# Patient Record
Sex: Female | Born: 1965 | Race: Asian | Hispanic: No | Marital: Married | Smoking: Never smoker
Health system: Southern US, Community
[De-identification: ages and names within clinical notes are randomized; demographics above are authoritative.]

---

## 1985-07-25 HISTORY — PX: ABDOMINAL SURGERY: SHX537

## 2012-10-27 ENCOUNTER — Ambulatory Visit (INDEPENDENT_AMBULATORY_CARE_PROVIDER_SITE_OTHER): Payer: BC Managed Care – PPO | Admitting: Family Medicine

## 2012-10-27 VITALS — BP 134/86 | HR 71 | Temp 99.0°F | Resp 17 | Ht 63.5 in | Wt 132.0 lb

## 2012-10-27 DIAGNOSIS — Z23 Encounter for immunization: Secondary | ICD-10-CM

## 2012-10-27 DIAGNOSIS — D72819 Decreased white blood cell count, unspecified: Secondary | ICD-10-CM

## 2012-10-27 DIAGNOSIS — Z0000 Encounter for general adult medical examination without abnormal findings: Secondary | ICD-10-CM

## 2012-10-27 LAB — COMPREHENSIVE METABOLIC PANEL
Albumin: 4.5 g/dL (ref 3.5–5.2)
Alkaline Phosphatase: 57 U/L (ref 39–117)
BUN: 11 mg/dL (ref 6–23)
Calcium: 9.4 mg/dL (ref 8.4–10.5)
Chloride: 104 mEq/L (ref 96–112)
Glucose, Bld: 100 mg/dL — ABNORMAL HIGH (ref 70–99)
Potassium: 4.5 mEq/L (ref 3.5–5.3)
Sodium: 138 mEq/L (ref 135–145)
Total Protein: 7.3 g/dL (ref 6.0–8.3)

## 2012-10-27 LAB — POCT CBC
Granulocyte percent: 47.9 %G (ref 37–80)
HCT, POC: 40.4 % (ref 37.7–47.9)
Hemoglobin: 12.5 g/dL (ref 12.2–16.2)
MCV: 70.2 fL — AB (ref 80–97)
POC Granulocyte: 1.6 — AB (ref 2–6.9)
RBC: 5.76 M/uL — AB (ref 4.04–5.48)

## 2012-10-27 LAB — LIPID PANEL
Cholesterol: 275 mg/dL — ABNORMAL HIGH (ref 0–200)
LDL Cholesterol: 182 mg/dL — ABNORMAL HIGH (ref 0–99)
Triglycerides: 89 mg/dL (ref <150)
VLDL: 18 mg/dL (ref 0–40)

## 2012-10-27 NOTE — Patient Instructions (Addendum)
I will be in touch with your when your labs come in.  You are over- due for a mammogram; our office will make an appointment for you to have a mammogram and we will call you about this.    When you turn 48 you will be due for a colonoscopy.  Please come and see [place] in one year for follow- up

## 2012-10-27 NOTE — Progress Notes (Addendum)
[facility] [address] [place] [address] [phone]  Date:  10/27/2012   Name:  Destiny Quinn   DOB:  January 18, 1966   MRN:  [id number]  PCP:  No PCP Per Patient    Chief Complaint: Annual Exam   History of Present Illness:  Destiny Quinn is a 47 y.o. very pleasant female patient who presents with the following:  Here today for a CPE.  She is a new patient.  She has not seen a doctor in some time.   She is fasting today for labs.  She would like to do a pap smear.   No known health problems.  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   She does not smoke or drink.  She does walk for exercise quite a bit.   She is here today with her husband.  They do not have any children.   She has not had a period in about 3 years.  No bleeding since then- she is through menopause.    She is due for a tetanus shot, and also has never had a mammogram.    In the 1980s she had some sort of operation in [place]- some sort of bladder procedure.    There is no problem list on file for this patient.   No past medical history on file.  No past surgical history on file.  History  Substance Use Topics  . Smoking status: Never Smoker   . Smokeless tobacco: Not on file  . Alcohol Use: Not on file    No family history on file.  No Known Allergies  Medication list has been reviewed and updated.  No current outpatient prescriptions on file prior to visit.   No current facility-administered medications on file prior to visit.    Review of Systems:  As per HPI- otherwise negative.   Physical Examination: Filed Vitals:   10/27/12 0936  BP: 134/86  Pulse: 71  Temp: 99 F (37.2 C)  Resp: 17   Filed Vitals:   10/27/12 0936  Height: 5' 3.5" (1.613 m)  Weight: 132 lb (59.875 kg)   Body mass index is 23.01 kg/(m^2). Ideal Body Weight: Weight in (lb) to have BMI = 25: 143.1  GEN: WDWN, NAD, Non-toxic, A & O x 3, appears well and fit for age HEENT: Atraumatic,  Normocephalic. Neck supple. No masses, No LAD.  Bilateral TM wnl, oropharynx normal.  PEERL,EOMI.   Ears and Nose: No external deformity. CV: RRR, No M/G/R. No JVD. No thrill. No extra heart sounds. PULM: CTA B, no wheezes, crackles, rhonchi. No retractions. No resp. distress. No accessory muscle use. ABD: S, NT, ND, +BS. No rebound. No HSM. EXTR: No c/c/e NEURO Normal gait.  PSYCH: Normally interactive. Conversant. Not depressed or anxious appearing.  Calm demeanor.  Breast: normal exam bilaterally, no masses, dimpling or discharge GU: normal exam, nulliparous cervix.  No adnexal TTP or masses.     Assessment and Plan: Physical exam, annual - Plan: POCT CBC, Comprehensive metabolic panel, TSH, Lipid panel, Pap IG, CT/NG w/ reflex HPV when ASC-U, Tdap vaccine greater than or equal to 7yo IM  CPE for age today.  Continue healthy habits.  Tdap today, pap and labs as above.  Mammogram needed- will have referral schedule as language is a problem Will plan further follow- up pending labs.   Signed Abbe Amsterdam, MD  4/10- called to discuss labs and cholesterol.  However, there is a language barrier and  I could not located her husband or other appropriate person with whom to discuss results.  Will send a letter

## 2012-10-30 LAB — PAP IG, CT-NG, RFX HPV ASCU: Chlamydia Probe Amp: NEGATIVE

## 2012-11-01 ENCOUNTER — Encounter: Payer: Self-pay | Admitting: Family Medicine

## 2012-11-01 NOTE — Addendum Note (Signed)
Addended by: Abbe Amsterdam C on: 11/01/2012 04:47 PM   Modules accepted: Orders

## 2012-12-22 ENCOUNTER — Ambulatory Visit (INDEPENDENT_AMBULATORY_CARE_PROVIDER_SITE_OTHER): Payer: BC Managed Care – PPO | Admitting: Family Medicine

## 2012-12-22 VITALS — BP 120/70 | HR 55 | Temp 98.5°F | Resp 16 | Ht 63.5 in | Wt 123.0 lb

## 2012-12-22 DIAGNOSIS — D72819 Decreased white blood cell count, unspecified: Secondary | ICD-10-CM

## 2012-12-22 DIAGNOSIS — E785 Hyperlipidemia, unspecified: Secondary | ICD-10-CM

## 2012-12-22 DIAGNOSIS — Z1231 Encounter for screening mammogram for malignant neoplasm of breast: Secondary | ICD-10-CM

## 2012-12-22 DIAGNOSIS — Z1239 Encounter for other screening for malignant neoplasm of breast: Secondary | ICD-10-CM

## 2012-12-22 LAB — POCT CBC
HCT, POC: 39.7 % (ref 37.7–47.9)
Lymph, poc: 1.8 (ref 0.6–3.4)
MCHC: 31 g/dL — AB (ref 31.8–35.4)
MCV: 70.8 fL — AB (ref 80–97)
MID (cbc): 0.2 (ref 0–0.9)
POC Granulocyte: 1.1 — AB (ref 2–6.9)
POC LYMPH PERCENT: 57.7 %L — AB (ref 10–50)
Platelet Count, POC: 218 10*3/uL (ref 142–424)
RDW, POC: 15.4 %

## 2012-12-22 LAB — LIPID PANEL
Cholesterol: 209 mg/dL — ABNORMAL HIGH (ref 0–200)
LDL Cholesterol: 133 mg/dL — ABNORMAL HIGH (ref 0–99)
Triglycerides: 74 mg/dL (ref <150)
VLDL: 15 mg/dL (ref 0–40)

## 2012-12-22 NOTE — Progress Notes (Addendum)
Subjective:    Patient ID: Destiny Quinn, female    DOB: [birth year], 47 y.o.   MRN: [id number] Chief Complaint  Patient presents with  . Follow-up    cholesterol and blood preesure    HPI  Ms. Holzheimer is a pleseant 47 yo [place] woman with no sig PMHx who is here with her husband who translaters for her.  About a month ago had an isolated episode where she felt lightheaded, no other sxs and has not recurred but they were were really worried she was having a stroke. Because of this, she came in for a full CPP - everything was found to be nml except LDL 189 so they are here to have it rechecked today and see if she needs medicaiton, looks like mammogram referral was not placed.  Also, Dr. Dallas Schimke has placed a future order for recheck of cbc due to a mild leukopenia.  History reviewed. No pertinent past medical history. No current outpatient prescriptions on file prior to visit.   No current facility-administered medications on file prior to visit.   No Known Allergies   Review of Systems  Constitutional: Positive for fatigue. Negative for fever, chills, diaphoresis and appetite change.  Eyes: Negative for visual disturbance.  Respiratory: Negative for cough and shortness of breath.   Cardiovascular: Negative for chest pain, palpitations and leg swelling.  Genitourinary: Negative for decreased urine volume.  Neurological: Positive for light-headedness. Negative for syncope and headaches.  Hematological: Does not bruise/bleed easily.      BP 120/70  Pulse 55  Temp(Src) 98.5 F (36.9 C) (Oral)  Resp 16  Ht 5' 3.5" (1.613 m)  Wt 123 lb (55.792 kg)  BMI 21.44 kg/m2  SpO2 99%  LMP 10/27/2012 Objective:   Physical Exam  Constitutional: She is oriented to person, place, and time. She appears well-developed and well-nourished. No distress.  HENT:  Head: Normocephalic and atraumatic.  Right Ear: External ear normal.  Left Ear: External ear normal.  Eyes: Conjunctivae are normal. No  scleral icterus.  Neck: Normal range of motion. Neck supple. Normal carotid pulses present. Carotid bruit is not present. No thyromegaly present.  Cardiovascular: Normal rate, regular rhythm, normal heart sounds and intact distal pulses.   Pulmonary/Chest: Effort normal and breath sounds normal. No respiratory distress.  Musculoskeletal: She exhibits no edema.  Lymphadenopathy:    She has no cervical adenopathy.  Neurological: She is alert and oriented to person, place, and time.  Skin: Skin is warm and dry. She is not diaphoretic. No erythema.  Psychiatric: She has a normal mood and affect. Her behavior is normal.          Results for orders placed in visit on 12/22/12  LIPID PANEL      Result Value Range   Cholesterol 209 (*) 0 - 200 mg/dL   Triglycerides 74  <409 mg/dL   HDL 61  >81 mg/dL   Total CHOL/HDL Ratio 3.4     VLDL 15  0 - 40 mg/dL   LDL Cholesterol 191 (*) 0 - 99 mg/dL  FERRITIN      Result Value Range   Ferritin 69  10 - 291 ng/mL  POCT CBC      Result Value Range   WBC 3.2 (*) 4.6 - 10.2 K/uL   Lymph, poc 1.8  0.6 - 3.4   POC LYMPH PERCENT 57.7 (*) 10 - 50 %L   MID (cbc) 0.2  0 - 0.9   POC MID % 6.8  0 -  12 %M   POC Granulocyte 1.1 (*) 2 - 6.9   Granulocyte percent 35.5 (*) 37 - 80 %G   RBC 5.61 (*) 4.04 - 5.48 M/uL   Hemoglobin 12.3  12.2 - 16.2 g/dL   HCT, POC 16.1  09.6 - 47.9 %   MCV 70.8 (*) 80 - 97 fL   MCH, POC 21.9 (*) 27 - 31.2 pg   MCHC 31.0 (*) 31.8 - 35.4 g/dL   RDW, POC 04.5     Platelet Count, POC 218  142 - 424 K/uL   MPV    0 - 99.8 fL    Assessment & Plan:  Screening breast examination - Plan: MM Digital Screening - prefers weekend due to work but could due Thurs afternoon if no other option. Gave handout in [place] on mammograms.  Other and unspecified hyperlipidemia - Plan: Lipid panel - recheck again as pt fasting today. If not improved, will need to start statin and pt and husband agreeable to plan. Gave info in [place] on  health diet and how to eat more fruits and veggies.  Leukopenia - Plan: Ferritin, POCT CBC - likely chronic as mild but cont monitoring and cons hiv test and smear, nml ferritin despite microcytic red cells  No orders of the defined types were placed in this encounter.     Call [facility] on cell phone for all appts and info - best way to reach husband who speaks [place]

## 2012-12-23 ENCOUNTER — Encounter: Payer: Self-pay | Admitting: Family Medicine

## 2013-08-10 ENCOUNTER — Telehealth: Payer: Self-pay

## 2013-08-10 DIAGNOSIS — Z1239 Encounter for other screening for malignant neoplasm of breast: Secondary | ICD-10-CM

## 2013-08-10 NOTE — Telephone Encounter (Signed)
Patient's husband came to clinic to inquire about sending his wife to a specialist for a breast exam. Informed him that I would give this message to clinical and have them return call with more information.  Please call Donnie Coffinhat Brodbeck @ [phone]

## 2013-08-12 NOTE — Telephone Encounter (Signed)
Pt was seen on 12/22/2012 for breast exam and it was recommended for her to have a mammogram. Can we refer?

## 2013-08-12 NOTE — Telephone Encounter (Signed)
If pt is having a problem with her breast she needs to have a diagnostic mammogram.  ? Language barrier - if able to get info over the phone if she is having a problem she should RTC so we can order the correct test - if she is not having a problem we can order a screening mammo.

## 2013-08-13 NOTE — Telephone Encounter (Signed)
Spoke to pt. Minor language barrier. Pt is not experiencing any problems with her breast. Denies lumps, tenderness, pain. Pt would like to have the mammogram as discussed at her last office visit. Order has been placed.

## 2013-08-28 ENCOUNTER — Ambulatory Visit
Admission: RE | Admit: 2013-08-28 | Discharge: 2013-08-28 | Disposition: A | Discharge status: Home / Self Care | Payer: 59 | Source: Ambulatory Visit | Attending: Family Medicine | Admitting: Family Medicine

## 2013-08-28 DIAGNOSIS — Z1231 Encounter for screening mammogram for malignant neoplasm of breast: Secondary | ICD-10-CM

## 2013-09-05 ENCOUNTER — Other Ambulatory Visit: Payer: Self-pay | Admitting: Family Medicine

## 2013-09-05 DIAGNOSIS — R928 Other abnormal and inconclusive findings on diagnostic imaging of breast: Secondary | ICD-10-CM

## 2013-09-11 ENCOUNTER — Other Ambulatory Visit: Payer: Self-pay | Admitting: Family Medicine

## 2013-09-11 ENCOUNTER — Other Ambulatory Visit: Payer: Self-pay

## 2013-09-11 DIAGNOSIS — R928 Other abnormal and inconclusive findings on diagnostic imaging of breast: Secondary | ICD-10-CM

## 2013-09-17 ENCOUNTER — Ambulatory Visit
Admission: RE | Admit: 2013-09-17 | Discharge: 2013-09-17 | Disposition: A | Discharge status: Home / Self Care | Payer: 59 | Source: Ambulatory Visit | Attending: Family Medicine | Admitting: Family Medicine

## 2013-09-17 DIAGNOSIS — R928 Other abnormal and inconclusive findings on diagnostic imaging of breast: Secondary | ICD-10-CM

## 2013-09-30 ENCOUNTER — Other Ambulatory Visit: Payer: Self-pay | Admitting: Physician Assistant

## 2013-09-30 DIAGNOSIS — R921 Mammographic calcification found on diagnostic imaging of breast: Secondary | ICD-10-CM

## 2013-10-08 ENCOUNTER — Other Ambulatory Visit: Payer: Self-pay | Admitting: Physician Assistant

## 2013-10-08 ENCOUNTER — Ambulatory Visit
Admission: RE | Admit: 2013-10-08 | Discharge: 2013-10-08 | Disposition: A | Discharge status: Home / Self Care | Payer: 59 | Source: Ambulatory Visit | Attending: Physician Assistant | Admitting: Physician Assistant

## 2013-10-08 DIAGNOSIS — R921 Mammographic calcification found on diagnostic imaging of breast: Secondary | ICD-10-CM

## 2013-10-09 ENCOUNTER — Ambulatory Visit
Admission: RE | Admit: 2013-10-09 | Discharge: 2013-10-09 | Disposition: A | Discharge status: Home / Self Care | Payer: 59 | Source: Ambulatory Visit | Attending: Physician Assistant | Admitting: Physician Assistant

## 2013-10-09 DIAGNOSIS — R921 Mammographic calcification found on diagnostic imaging of breast: Secondary | ICD-10-CM

## 2014-01-01 ENCOUNTER — Ambulatory Visit (INDEPENDENT_AMBULATORY_CARE_PROVIDER_SITE_OTHER): Payer: 59 | Admitting: Family Medicine

## 2014-01-01 VITALS — BP 120/74 | HR 67 | Temp 98.7°F | Resp 16 | Ht 63.5 in | Wt 126.0 lb

## 2014-01-01 DIAGNOSIS — R5381 Other malaise: Secondary | ICD-10-CM

## 2014-01-01 DIAGNOSIS — Z609 Problem related to social environment, unspecified: Secondary | ICD-10-CM

## 2014-01-01 DIAGNOSIS — R5383 Other fatigue: Secondary | ICD-10-CM

## 2014-01-01 DIAGNOSIS — R42 Dizziness and giddiness: Secondary | ICD-10-CM

## 2014-01-01 DIAGNOSIS — H538 Other visual disturbances: Secondary | ICD-10-CM

## 2014-01-01 DIAGNOSIS — Z789 Other specified health status: Secondary | ICD-10-CM

## 2014-01-01 DIAGNOSIS — H9319 Tinnitus, unspecified ear: Secondary | ICD-10-CM

## 2014-01-01 LAB — POCT CBC
Granulocyte percent: 46.5 %G (ref 37–80)
HCT, POC: 44 % (ref 37.7–47.9)
Hemoglobin: 13.7 g/dL (ref 12.2–16.2)
Lymph, poc: 1.8 (ref 0.6–3.4)
MCH, POC: 22.2 pg — AB (ref 27–31.2)
MCHC: 31.1 g/dL — AB (ref 31.8–35.4)
MCV: 71.5 fL — AB (ref 80–97)
MID (CBC): 0.2 (ref 0–0.9)
MPV: 8.3 fL (ref 0–99.8)
PLATELET COUNT, POC: 331 10*3/uL (ref 142–424)
POC Granulocyte: 1.8 — AB (ref 2–6.9)
POC LYMPH PERCENT: 47.2 %L (ref 10–50)
POC MID %: 6.3 % (ref 0–12)
RBC: 6.16 M/uL — AB (ref 4.04–5.48)
RDW, POC: 15.9 %
WBC: 3.9 10*3/uL — AB (ref 4.6–10.2)

## 2014-01-01 LAB — POCT SEDIMENTATION RATE: POCT SED RATE: 12 mm/hr (ref 0–22)

## 2014-01-01 LAB — GLUCOSE, POCT (MANUAL RESULT ENTRY): POC Glucose: 91 mg/dl (ref 70–99)

## 2014-01-01 MED ORDER — MECLIZINE HCL 12.5 MG PO TABS: 12.5000 mg | ORAL_TABLET | Freq: Three times a day (TID) | ORAL | Status: DC | PRN

## 2014-01-01 MED ORDER — DIAZEPAM 2 MG PO TABS: 2.0000 mg | ORAL_TABLET | Freq: Four times a day (QID) | ORAL | Status: DC | PRN

## 2014-01-01 NOTE — Progress Notes (Signed)
Subjective: 48 year old [place] American lady who does not speak a lot of [place]. Her husband came with her and helped to interpret well for her. She apparently has done well since her last physical examination a year ago. However about 2 weeks ago she had an episode of sudden onset dizziness and sensation of loss or blurring of vision. She has had some intermittent mild ringing in the ears. She felt like she could do nothing when this happened. She was driving initially. Yesterday she had the same thing happened again when she was walking. She felt nauseated but did not vomit. She felt abnormal in her hand but it wasn't really a bad headache. She had no loss of consciousness or falling down. She had to miss work yesterday and again today. She has had some mild dizziness episodes in the past, but nothing like these 2 episodes. Not reporting any episodes of vertigo triggered by rolling over in bed. She has felt fatigued. 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  Objective: Pleasant alert somewhat anxious lady who does not speak a lot of [place]. Her TMs are normal, small amount of dry wax in the left. Eyes PERRLA. EOMs intact. Fundi appear benign. Throat clear. Neck supple without nodes. No carotid bruits. Chest is clear to auscultation. Heart regular without murmurs. Cranial nerves grossly intact. Finger to nose normal. Romberg negative. Tandem walk normal. Motor is symmetrical. Deep tendon reflexes symmetrical.  Assessment: Vertigo, recurrent Mild ringing in ears Visual blurring Language barrier  Plan:  Check a CBC, sedimentation rate, glucose and Cmet. Assuming these are normal, will treat with Antivert for about a week. Leave off work today. Give some 2 milligrams diazepam to use on a when necessary basis. Cautioned her to pull over if driving and this should start,. Call for help if needed. Return in about 2 weeks for her  annual physical examination. She was coming in for that today, but we converted this to an acute care visit.  Results for orders placed in visit on 01/01/14  POCT CBC      Result Value Ref Range   WBC 3.9 (*) 4.6 - 10.2 K/uL   Lymph, poc 1.8  0.6 - 3.4   POC LYMPH PERCENT 47.2  10 - 50 %L   MID (cbc) 0.2  0 - 0.9   POC MID % 6.3  0 - 12 %M   POC Granulocyte 1.8 (*) 2 - 6.9   Granulocyte percent 46.5  37 - 80 %G   RBC 6.16 (*) 4.04 - 5.48 M/uL   Hemoglobin 13.7  12.2 - 16.2 g/dL   HCT, POC 73.7  10.6 - 47.9 %   MCV 71.5 (*) 80 - 97 fL   MCH, POC 22.2 (*) 27 - 31.2 pg   MCHC 31.1 (*) 31.8 - 35.4 g/dL   RDW, POC 26.9     Platelet Count, POC 331  142 - 424 K/uL   MPV 8.3  0 - 99.8 fL  GLUCOSE, POCT (MANUAL RESULT ENTRY)      Result Value Ref Range   POC Glucose 91  70 - 99 mg/dl

## 2014-01-01 NOTE — Patient Instructions (Addendum)
Take meclizine 12.5 milligrams 3 times daily in the morning, afternoon, and evening for one week. Save the rest of the medicine just to use if needed for further episodes of this.  Take diazepam 2 mg one pill only if needed for an acute attack of the dizziness or vision blurring. This is not to be taken on a regular basis.  If worse episodes return sooner, otherwise just come back for the regular yearly physical about 2 weeks from now.  I work 8-4 on June 24  If you're driving and have an episode like this you should pull off the road immediately and call for help if necessary.

## 2014-01-02 LAB — COMPREHENSIVE METABOLIC PANEL
ALK PHOS: 60 U/L (ref 39–117)
ALT: 17 U/L (ref 0–35)
AST: 14 U/L (ref 0–37)
Albumin: 4.9 g/dL (ref 3.5–5.2)
BILIRUBIN TOTAL: 0.7 mg/dL (ref 0.2–1.2)
BUN: 12 mg/dL (ref 6–23)
CO2: 22 mEq/L (ref 19–32)
Calcium: 9.7 mg/dL (ref 8.4–10.5)
Chloride: 104 mEq/L (ref 96–112)
Creat: 0.55 mg/dL (ref 0.50–1.10)
Glucose, Bld: 103 mg/dL — ABNORMAL HIGH (ref 70–99)
Potassium: 5 mEq/L (ref 3.5–5.3)
SODIUM: 140 meq/L (ref 135–145)
TOTAL PROTEIN: 7.6 g/dL (ref 6.0–8.3)

## 2014-01-15 ENCOUNTER — Ambulatory Visit (INDEPENDENT_AMBULATORY_CARE_PROVIDER_SITE_OTHER): Payer: 59 | Admitting: Family Medicine

## 2014-01-15 VITALS — BP 120/68 | HR 62 | Temp 98.9°F | Resp 16 | Ht 63.0 in | Wt 128.6 lb

## 2014-01-15 DIAGNOSIS — Z0000 Encounter for general adult medical examination without abnormal findings: Secondary | ICD-10-CM

## 2014-01-15 DIAGNOSIS — R42 Dizziness and giddiness: Secondary | ICD-10-CM | POA: Diagnosis not present

## 2014-01-15 LAB — COMPREHENSIVE METABOLIC PANEL
ALK PHOS: 72 U/L (ref 39–117)
ALT: 34 U/L (ref 0–35)
AST: 23 U/L (ref 0–37)
Albumin: 4.9 g/dL (ref 3.5–5.2)
BUN: 12 mg/dL (ref 6–23)
CO2: 28 mEq/L (ref 19–32)
Calcium: 10 mg/dL (ref 8.4–10.5)
Chloride: 103 mEq/L (ref 96–112)
Creat: 0.66 mg/dL (ref 0.50–1.10)
GLUCOSE: 95 mg/dL (ref 70–99)
Potassium: 5 mEq/L (ref 3.5–5.3)
Sodium: 140 mEq/L (ref 135–145)
Total Bilirubin: 0.6 mg/dL (ref 0.2–1.2)
Total Protein: 7.8 g/dL (ref 6.0–8.3)

## 2014-01-15 LAB — POCT CBC
Granulocyte percent: 38.9 %G (ref 37–80)
HCT, POC: 42.4 % (ref 37.7–47.9)
Hemoglobin: 13.2 g/dL (ref 12.2–16.2)
Lymph, poc: 2.4 (ref 0.6–3.4)
MCH, POC: 22.3 pg — AB (ref 27–31.2)
MCHC: 31.1 g/dL — AB (ref 31.8–35.4)
MCV: 71.7 fL — AB (ref 80–97)
MID (cbc): 0.3 (ref 0–0.9)
MPV: 9.7 fL (ref 0–99.8)
PLATELET COUNT, POC: 307 10*3/uL (ref 142–424)
POC GRANULOCYTE: 1.7 — AB (ref 2–6.9)
POC LYMPH PERCENT: 55 %L — AB (ref 10–50)
POC MID %: 6.1 % (ref 0–12)
RBC: 5.91 M/uL — AB (ref 4.04–5.48)
RDW, POC: 16.4 %
WBC: 4.3 10*3/uL — AB (ref 4.6–10.2)

## 2014-01-15 LAB — POCT SEDIMENTATION RATE

## 2014-01-15 LAB — LIPID PANEL
Cholesterol: 299 mg/dL — ABNORMAL HIGH (ref 0–200)
HDL: 56 mg/dL (ref >39)
LDL Cholesterol: 206 mg/dL — ABNORMAL HIGH (ref 0–99)
TRIGLYCERIDES: 183 mg/dL — AB (ref <150)
Total CHOL/HDL Ratio: 5.3 Ratio
VLDL: 37 mg/dL (ref 0–40)

## 2014-01-15 LAB — POCT GLYCOSYLATED HEMOGLOBIN (HGB A1C): Hemoglobin A1C: 5.6

## 2014-01-15 LAB — GLUCOSE, POCT (MANUAL RESULT ENTRY): POC GLUCOSE: 87 mg/dL (ref 70–99)

## 2014-01-15 MED ORDER — MECLIZINE HCL 12.5 MG PO TABS: 12.5000 mg | ORAL_TABLET | Freq: Three times a day (TID) | ORAL | Status: DC | PRN

## 2014-01-15 NOTE — Progress Notes (Signed)
Physical exam:  History: Patient is here for a completion of her physical and followup on the dizziness episode. She has done well. The meclizine has helped her a lot. She had one daily she was worse and she started taking the medicine regularly and has done well. She would like some more of the meclizine.  Past medical history:  General he is been a healthy person Operations: Cholecystectomy Major illnesses: None Regular medications: None except the meclizine and when necessary diazepam Allergies: None Last period: No longer has her menses  Family history: All her family is deceased  Social history: Married. Works a Theatre stage managerassembly line. They attend the [place] [address].  Review of systems: Occasional palpitations. Occasional dyspnea. Remaining review of systems is really unremarkable.  Physical exam: Pleasant lady in no acute distress. TMs normal. Eyes PERRLA. Fundi benign. Throat clear. Neck supple without nodes thyromegaly. No carotid bruits. Chest clear. Heart regular without murmurs. Abdomen soft without mass or tenderness. Extremities unremarkable. Skin unremarkable. Had her breast mammogram earlier this year.  Assessment: Dizziness and vertigo, improved Otherwise normal physical exam  Plan: Continue using the meclizine on an in as-needed basis.  Return annually

## 2014-01-15 NOTE — Patient Instructions (Signed)
Use the meclizine when needed for the dizziness. If you're doing well without it you can just keep it for when needed.  Return yearly for a physical examination, sooner if needed.

## 2014-01-22 MED ORDER — PRAVASTATIN SODIUM 20 MG PO TABS: 20.0000 mg | ORAL_TABLET | Freq: Every day | ORAL | Status: DC

## 2014-01-22 NOTE — Addendum Note (Signed)
Addended by: Johnnette LitterARDWELL, Casin Federici M on: 01/22/2014 01:39 PM   Modules accepted: Orders

## 2014-04-19 ENCOUNTER — Ambulatory Visit (INDEPENDENT_AMBULATORY_CARE_PROVIDER_SITE_OTHER): Payer: 59 | Admitting: Family Medicine

## 2014-04-19 VITALS — BP 128/72 | HR 78 | Temp 98.3°F | Resp 16 | Ht 63.5 in | Wt 123.8 lb

## 2014-04-19 DIAGNOSIS — H6122 Impacted cerumen, left ear: Secondary | ICD-10-CM

## 2014-04-19 DIAGNOSIS — H6120 Impacted cerumen, unspecified ear: Secondary | ICD-10-CM

## 2014-04-19 DIAGNOSIS — E785 Hyperlipidemia, unspecified: Secondary | ICD-10-CM

## 2014-04-19 MED ORDER — PRAVASTATIN SODIUM 20 MG PO TABS: 20.0000 mg | ORAL_TABLET | Freq: Every day | ORAL | Status: DC

## 2014-04-19 NOTE — Patient Instructions (Signed)
Continue the pravastatin 20 mg one daily. However we will see what the results of the lab tests show, and if it is not low enough still with to increase the dose.  Plan to return in about February.

## 2014-04-19 NOTE — Progress Notes (Signed)
Subjective: 48 year old lady well-known to me who has been on medicine for cholesterol. About 2 weeks ago she had a noise in her left here. There were some discomfort. She has not been having the dizziness. Otherwise she feels well with no major complaints.  Objective Right TM is normal. Left occluded by pleural fluid. Throat clear. Neck supple without nodes. Chest clear. Heart regular without murmurs. And soft.  Assessment:  Hyperlipidemia Otalgia Cerumen impaction left ear, irrigated by physician  Plan: Irrigate left ear Check lipids Continue the pravastatin 20 mg daily and lasts the labs showed that she needs a different dose.  Procedure: Assistant had work on irrigating the year out, but only got it partially clean. There is wax impacted against the drum. I personally did further work with curetting and irrigating until I was able to get the ear cleaned out and could well visualize the drum. There is a little canal erythema from the procedure, but the drum looks fine now.

## 2014-04-20 LAB — LIPID PANEL
CHOL/HDL RATIO: 2.8 ratio
Cholesterol: 167 mg/dL (ref 0–200)
HDL: 60 mg/dL (ref >39)
LDL Cholesterol: 69 mg/dL (ref 0–99)
Triglycerides: 191 mg/dL — ABNORMAL HIGH (ref <150)
VLDL: 38 mg/dL (ref 0–40)

## 2014-04-20 LAB — COMPREHENSIVE METABOLIC PANEL
ALBUMIN: 4.4 g/dL (ref 3.5–5.2)
ALT: 16 U/L (ref 0–35)
AST: 18 U/L (ref 0–37)
Alkaline Phosphatase: 61 U/L (ref 39–117)
BUN: 10 mg/dL (ref 6–23)
CO2: 29 mEq/L (ref 19–32)
Calcium: 9.5 mg/dL (ref 8.4–10.5)
Chloride: 106 mEq/L (ref 96–112)
Creat: 0.84 mg/dL (ref 0.50–1.10)
GLUCOSE: 120 mg/dL — AB (ref 70–99)
POTASSIUM: 4.3 meq/L (ref 3.5–5.3)
SODIUM: 142 meq/L (ref 135–145)
Total Bilirubin: 0.3 mg/dL (ref 0.2–1.2)
Total Protein: 7.2 g/dL (ref 6.0–8.3)

## 2014-04-21 ENCOUNTER — Encounter: Payer: Self-pay | Admitting: *Deleted

## 2015-12-18 ENCOUNTER — Other Ambulatory Visit: Payer: Self-pay

## 2015-12-18 DIAGNOSIS — Z1231 Encounter for screening mammogram for malignant neoplasm of breast: Secondary | ICD-10-CM

## 2016-01-01 ENCOUNTER — Other Ambulatory Visit: Payer: Self-pay | Admitting: Family Medicine

## 2016-01-01 ENCOUNTER — Ambulatory Visit
Admission: RE | Admit: 2016-01-01 | Discharge: 2016-01-01 | Disposition: A | Discharge status: Home / Self Care | Payer: No Typology Code available for payment source | Source: Ambulatory Visit

## 2016-01-01 DIAGNOSIS — Z1231 Encounter for screening mammogram for malignant neoplasm of breast: Secondary | ICD-10-CM

## 2016-07-15 ENCOUNTER — Ambulatory Visit (INDEPENDENT_AMBULATORY_CARE_PROVIDER_SITE_OTHER): Payer: Self-pay | Admitting: Family Medicine

## 2016-07-15 ENCOUNTER — Ambulatory Visit (INDEPENDENT_AMBULATORY_CARE_PROVIDER_SITE_OTHER): Payer: Self-pay

## 2016-07-15 VITALS — BP 100/68 | HR 74 | Temp 97.9°F | Resp 18 | Ht 63.5 in | Wt 139.0 lb

## 2016-07-15 DIAGNOSIS — M87052 Idiopathic aseptic necrosis of left femur: Secondary | ICD-10-CM

## 2016-07-15 DIAGNOSIS — M25562 Pain in left knee: Secondary | ICD-10-CM

## 2016-07-15 DIAGNOSIS — Z23 Encounter for immunization: Secondary | ICD-10-CM

## 2016-07-15 MED ORDER — DICLOFENAC SODIUM 75 MG PO TBEC: 75.0000 mg | DELAYED_RELEASE_TABLET | Freq: Two times a day (BID) | ORAL | 0 refills | Status: DC

## 2016-07-15 NOTE — Progress Notes (Signed)
Patient ID: Destiny Quinn, female    DOB: 1966/03/27  Age: 50 y.o. MRN: [id number]  Chief Complaint  Patient presents with  . Knee Pain    LEFT    Subjective:   50 year old lady who is been having pain in her left knee for the last couple of days. No known injury. It hurts her to sit down and squat down. 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 Has had some intermittent pain with that knee in the past, but this is different. No known injury.  Current allergies, medications, problem list, past/family and social histories reviewed.  Objective:  BP 100/68 (BP Location: Right Arm, Patient Position: Sitting, Cuff Size: Small)   Pulse 74   Temp 97.9 F (36.6 C) (Oral)   Resp 18   Ht 5' 3.5" (1.613 m)   Wt 139 lb (63 kg)   LMP 10/27/2012   SpO2 98%   BMI 24.24 kg/m   No major distress. Good range of motion of the knee without effusion or crepitance or point tenderness. Generalized pain.  Assessment & Plan:   Assessment: 1. Need for prophylactic vaccination and inoculation against influenza   2. Acute pain of left knee   3. Bone infarct of distal femur, left (HCC)       Plan: X-ray IMPRESSION: Subchondral sclerosis in both femoral condyles, likely bone infarcts. These could be subacute and symptomatic. Consider MRI for further evaluation.   Orders Placed This Encounter  Procedures  . DG Knee Complete 4 Views Left    Standing Status:   Future    Number of Occurrences:   1    Standing Expiration Date:   07/15/2017    Order Specific Question:   Reason for Exam (SYMPTOM  OR DIAGNOSIS REQUIRED)    Answer:   pain left knee, no injury    Order Specific Question:   Is the patient pregnant?    Answer:   No    Order Specific Question:   Preferred imaging location?    Answer:   External  . Flu Vaccine QUAD 36+ mos IM    No orders of the defined types were placed in this encounter.   See instructions. If the knee continues to bother her we will  send her to a specialist for further evaluation. They're to get back to us if pain persists despite treating the knee for a couple of weeks with the diclofenac.     Patient Instructions   Try to rest your knee for the next few days.  Take diclofenac one twice daily for pain and inflammation  If necessary you can buy a knee splint at a pharmacy to wear to give your lower leg some support.  If you continue having a lot of pain over the next couple of weeks we will send you to a specialist if necessary, but at this point I think it is best just to give it some time.  The x-ray does show some abnormality of the bone of the left knee, and if the medicine does not make you feel better I think it is important that we send you to a specialist to get this checked. Return as needed.    IF you received an x-ray today, you will receive an invoice from [facility]. Please contact [facility] at [phone] with questions or concerns regarding your invoice.   IF you received labwork today, you will receive an invoice from [facility]. Please contact  [facility] at [phone] with questions or concerns regarding your invoice.   Our billing staff will not be able to assist you with questions regarding bills from these companies.  You will be contacted with the lab results as soon as they are available. The fastest way to get your results is to activate your My Chart account. Instructions are located on the last page of this paperwork. If you have not heard from us regarding the results in 2 weeks, please contact this office.        No Follow-up on file.   HOPPER,DAVID, MD 07/15/2016

## 2016-07-15 NOTE — Patient Instructions (Addendum)
Try to rest your knee for the next few days.  Take diclofenac one twice daily for pain and inflammation  If necessary you can buy a knee splint at a pharmacy to wear to give your lower leg some support.  If you continue having a lot of pain over the next couple of weeks we will send you to a specialist if necessary, but at this point I think it is best just to give it some time.  The x-ray does show some abnormality of the bone of the left knee, and if the medicine does not make you feel better I think it is important that we send you to a specialist to get this checked. Return as needed.    IF you received an x-ray today, you will receive an invoice from [facility]. Please contact [facility] at [phone] with questions or concerns regarding your invoice.   IF you received labwork today, you will receive an invoice from [facility]. Please contact [facility] at [phone] with questions or concerns regarding your invoice.   Our billing staff will not be able to assist you with questions regarding bills from these companies.  You will be contacted with the lab results as soon as they are available. The fastest way to get your results is to activate your My Chart account. Instructions are located on the last page of this paperwork. If you have not heard from us regarding the results in 2 weeks, please contact this office.

## 2016-10-22 ENCOUNTER — Encounter: Payer: Self-pay | Admitting: Family Medicine

## 2016-10-22 ENCOUNTER — Ambulatory Visit (INDEPENDENT_AMBULATORY_CARE_PROVIDER_SITE_OTHER): Payer: Self-pay | Admitting: Family Medicine

## 2016-10-22 VITALS — BP 144/80 | HR 60 | Temp 98.8°F | Resp 16 | Ht 63.5 in | Wt 138.1 lb

## 2016-10-22 DIAGNOSIS — M25562 Pain in left knee: Secondary | ICD-10-CM

## 2016-10-22 DIAGNOSIS — Z23 Encounter for immunization: Secondary | ICD-10-CM

## 2016-10-22 DIAGNOSIS — M958 Other specified acquired deformities of musculoskeletal system: Secondary | ICD-10-CM

## 2016-10-22 DIAGNOSIS — M2580 Other specified joint disorders, unspecified joint: Secondary | ICD-10-CM

## 2016-10-22 DIAGNOSIS — M898X9 Other specified disorders of bone, unspecified site: Secondary | ICD-10-CM

## 2016-10-22 DIAGNOSIS — M879 Osteonecrosis, unspecified: Secondary | ICD-10-CM

## 2016-10-22 DIAGNOSIS — R030 Elevated blood-pressure reading, without diagnosis of hypertension: Secondary | ICD-10-CM

## 2016-10-22 MED ORDER — DICLOFENAC SODIUM 75 MG PO TBEC: 75.0000 mg | DELAYED_RELEASE_TABLET | Freq: Two times a day (BID) | ORAL | 1 refills | Status: DC

## 2016-10-22 NOTE — Patient Instructions (Addendum)
IF you received an x-ray today, you will receive an invoice from [facility]. Please contact [facility] at [phone] with questions or concerns regarding your invoice.   IF you received labwork today, you will receive an invoice from [place]. Please contact [facility] at [phone] with questions or concerns regarding your invoice.   Our billing staff will not be able to assist you with questions regarding bills from these companies.  You will be contacted with the lab results as soon as they are available. The fastest way to get your results is to activate your My Chart account. Instructions are located on the last page of this paperwork. If you have not heard from [place] regarding the results in 2 weeks, please contact this office.    Avascular Necrosis Avascular necrosis is a disease resulting from the temporary or permanent loss of blood supply to a bone. This disease may also be known as:  Osteonecrosis.  Aseptic necrosis.  Ischemic bone necrosis. Without proper blood supply, the internal layer of the affected bone dies and the outer layer of the bone may break down. If this process affects a bone near a joint, it may lead to collapse of that joint. Common bones that are affected by this condition include:  The top of your thigh bone (femoral head).  One or more bones in your wrist (scaphoid orlunate).  One or more bones in your foot (metatarsals).  One of the bones in your ankle (navicular). The joint most commonly affected by this condition is the hip joint. Avascular necrosis rarely occurs in more than one bone at a time. What are the causes?  Damage or injury to a bone or joint.  Using corticosteroid medicine for a long period of time.  Changes in your immune or hormone systems.  Excessive exposure to radiation. What increases the risk?  Alcohol abuse.  Previous traumatic injury to a joint.  Using corticosteroid medicines for a long  period of time or often.  Having a medical condition such as:  HIV or AIDS.  Diabetes.  Sickle cell disease.  An autoimmune disease. What are the signs or symptoms? The main symptoms of avascular necrosis are pain and decreased motion in the affected bone or joint. In the early stages the pain may be minor and occur only with activity. As avascular necrosis progresses, pain may gradually worsen and occur while at rest. The pain may suddenly become severe if an affected joint collapses. How is this diagnosed? Avascular necrosis may be diagnosed with:  A medical history.  A physical exam.  X-rays.  An MRI.  A bone scan. How is this treated? Treatments may include:  Medicine to help relieve pain.  Avoiding placing any pressure or weight ontheaffected area. If avascular necrosis occurs in your hip, ankle, or foot, you may be instructed to use crutches or a rolling scooter.  Surgery, such as:  Core decompression. In this surgery, one or more holes are placed in the bone for new blood vessels to grow into. This provides a renewed blood supply to the bone. Core decompression can often reduce pain and pressure in the affected bone and slow the progression of bone and joint destruction.  Osteotomy. In this surgery, the bone is reshaped to reduce stress on the affected area of the joint.  Bone grafting. In this surgery, healthy bone from one part of your body is transplanted to the affected area.  Arthroplasty. Arthroplasty is also known as total joint replacement. In  this surgery, the affected surface on one or both sides of a joint is replaced with artificial parts (prostheses).  Electrical stimulation. This may help encourage new bone growth. Follow these instructions at home:  Take medicines only as directed by your health care provider.  Follow your health care provider's recommendations on limiting activities or using crutches to rest your affected joint.  Meet with  aphysical therapist as directed by your health care provider.  Keep all follow-up visits as directed by your health care provider. This is important. Contact a health care provider if:  Your pain worsens.  You have decreased motion in your affected joint. Get help right away if: Your pain suddenly becomes severe. This information is not intended to replace advice given to you by your health care provider. Make sure you discuss any questions you have with your health care provider. Document Released: 12/31/2001 Document Revised: 12/17/2015 Document Reviewed: 09/18/2013 Elsevier Interactive Patient Education  2017 [employer].

## 2016-10-22 NOTE — Progress Notes (Signed)
Subjective:  By signing my name below, I, Stann Ore, attest that this documentation has been prepared under the direction and in the presence of Norberto Sorenson, MD. Electronically Signed: Stann Ore, Scribe. 10/22/2016 , 8:40 AM .  Patient was seen in Room 2 .   Patient ID: Destiny Quinn, female    DOB: [birth year], 51 y.o.   MRN: [id number] Chief Complaint  Patient presents with  . Knee Pain    Left   HPI Destiny Quinn is a 51 y.o. female who presents to Primary Care at [facility] complaining of left knee pain. She was last seen 3 months ago for the same symptom. 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 She was instructed to rest, diclofenac BID, and obtaining a brace. If pain persists, referral to specialist will be made. Consider MRI for further evaluation advised as subacute infarct could be symptomatic.   Patient states she returned to the [place] from [place] 2 weeks ago. She reports not taking medications while in [place] or taken any OTC medications. She mentions having relief with diclofenac. She notes a bruise over her left knee over past few days, but denies any known injury. She denies any falls or any known injuries to her knee. She denies applying ice over the area. She's still using her brace at home.   She also mentions having occasional left lateral breast pain. She had bilateral biopsy done in 2015, which showed fibrocystic with calcifications. She denies history of cancer.   Her last labs were done in 2015. At that point, patient had been started on pravastatin , because LDL was 206 within non HDL of 243, which was reduced to LDL of 69, within non HDL of 107. She reports that she was eating poorly before, and now better.   Her primary language is [place] She had a family member translate for her in the room.   No past medical history on file. Prior to Admission medications   Medication Sig Start Date End Date Taking?  Authorizing Provider  diazepam (VALIUM) 2 MG tablet Take 1 tablet (2 mg total) by mouth every 6 (six) hours as needed for anxiety. Patient not taking: Reported on 07/15/2016 01/01/14   Peyton Najjar, MD  diclofenac (VOLTAREN) 75 MG EC tablet Take 1 tablet (75 mg total) by mouth 2 (two) times daily. 07/15/16   Peyton Najjar, MD  meclizine (ANTIVERT) 12.5 MG tablet Take 1 tablet (12.5 mg total) by mouth 3 (three) times daily as needed for dizziness. Patient not taking: Reported on 07/15/2016 01/15/14   Peyton Najjar, MD  pravastatin (PRAVACHOL) 20 MG tablet Take 1 tablet (20 mg total) by mouth daily. Patient not taking: Reported on 07/15/2016 04/19/14   Peyton Najjar, MD   No Known Allergies  Review of Systems  Constitutional: Negative for chills, fatigue, fever and unexpected weight change.  Respiratory: Negative for cough.   Gastrointestinal: Negative for constipation, diarrhea, nausea and vomiting.  Musculoskeletal: Positive for arthralgias.  Skin: Negative for rash and wound.  Neurological: Negative for dizziness, weakness and headaches.       Objective:   Physical Exam  Constitutional: She is oriented to person, place, and time. She appears well-developed and well-nourished. No distress.  HENT:  Head: Normocephalic and atraumatic.  Eyes: EOM are normal. Pupils are equal, round, and reactive to light.  Neck: Neck supple.  Cardiovascular: Normal rate.   Pulmonary/Chest: Effort normal. No  respiratory distress.  Musculoskeletal: Normal range of motion.  No jointline tenderness, tender to palpation over the lateral greater than medial femoral condyles, no patellar tenderness, slight effusion in popliteal fossa, minimal crepitous bilaterally, pain with valgus stress, negative mcmurray's  Neurological: She is alert and oriented to person, place, and time.  Skin: Skin is warm and dry.  Psychiatric: She has a normal mood and affect. Her behavior is normal.  Nursing note and vitals  reviewed.   BP (!) 148/91   Pulse 60   Temp 98.8 F (37.1 C) (Oral)   Resp 16   Ht 5' 3.5" (1.613 m)   Wt 138 lb 2 oz (62.7 kg)   LMP 10/27/2012   SpO2 100%   BMI 24.08 kg/m     Assessment & Plan:  Consider bringing in [place] interpreter to next visit - her family member with her translates but not sure everything is getting through exactly (such as when we discussed h/o elevated cholesterol). Pt declines restarting pravastatin as "she feels fine" and also has changed her diet to be healthier.  no history of elevated BP prior - poss today due to Pain? Recheck at f/u  1. Bone infarction (HCC)   2. Need for prophylactic vaccination and inoculation against influenza   3. Acute pain of left knee   4. Elevated blood pressure reading   5. Subchondral sclerosis   6. Osteochondral defect of femoral condyle   Unknown etiology of subchondral sclerosis so refer to ortho for further eval. Unfortunately as pt is self-pay I would like to avoid any unnecessary testing and i'm sure the out of pocket cost of a MRI could be terrifying esp considering that she is getting pretty good symptom relief with an nsaid but I am concerned that depending on the inciting etiology that this could potentially worsen or occur in other locations so would appreciate specialist insight. Pt understands and agrees.  Orders Placed This Encounter  Procedures  . Ambulatory referral to Orthopedic Surgery    Referral Priority:   Routine    Referral Type:   Surgical    Referral Reason:   Specialty Services Required    Requested Specialty:   Orthopedic Surgery    Number of Visits Requested:   1  . Care order/instruction:    Scheduling Instructions:     Recheck BP    Meds ordered this encounter  Medications  . diclofenac (VOLTAREN) 75 MG EC tablet    Sig: Take 1 tablet (75 mg total) by mouth 2 (two) times daily.    Dispense:  60 tablet    Refill:  1    I personally performed the services described in this  documentation, which was scribed in my presence. The recorded information has been reviewed and considered, and addended by me as needed.   Norberto Sorenson, M.D.  Primary Care at [facility] [address] [place], [place] [address] [phone] phone [phone] fax  10/22/16 8:55 AM

## 2016-12-22 ENCOUNTER — Ambulatory Visit (INDEPENDENT_AMBULATORY_CARE_PROVIDER_SITE_OTHER): Payer: Self-pay | Admitting: Specialist

## 2017-11-25 ENCOUNTER — Ambulatory Visit (INDEPENDENT_AMBULATORY_CARE_PROVIDER_SITE_OTHER): Payer: PRIVATE HEALTH INSURANCE | Admitting: Physician Assistant

## 2017-11-25 ENCOUNTER — Encounter: Payer: Self-pay | Admitting: Physician Assistant

## 2017-11-25 ENCOUNTER — Other Ambulatory Visit: Payer: Self-pay

## 2017-11-25 VITALS — BP 130/72 | HR 66 | Temp 98.6°F | Ht 63.0 in | Wt 130.0 lb

## 2017-11-25 DIAGNOSIS — Z1211 Encounter for screening for malignant neoplasm of colon: Secondary | ICD-10-CM

## 2017-11-25 DIAGNOSIS — Z0000 Encounter for general adult medical examination without abnormal findings: Secondary | ICD-10-CM

## 2017-11-25 DIAGNOSIS — Z13228 Encounter for screening for other metabolic disorders: Secondary | ICD-10-CM

## 2017-11-25 DIAGNOSIS — Z114 Encounter for screening for human immunodeficiency virus [HIV]: Secondary | ICD-10-CM

## 2017-11-25 DIAGNOSIS — Z1159 Encounter for screening for other viral diseases: Secondary | ICD-10-CM | POA: Diagnosis not present

## 2017-11-25 DIAGNOSIS — Z13220 Encounter for screening for lipoid disorders: Secondary | ICD-10-CM

## 2017-11-25 DIAGNOSIS — Z130 Encounter for screening for diseases of the blood and blood-forming organs and certain disorders involving the immune mechanism: Secondary | ICD-10-CM

## 2017-11-25 DIAGNOSIS — M25511 Pain in right shoulder: Secondary | ICD-10-CM | POA: Diagnosis not present

## 2017-11-25 DIAGNOSIS — G8929 Other chronic pain: Secondary | ICD-10-CM | POA: Diagnosis not present

## 2017-11-25 DIAGNOSIS — Z1329 Encounter for screening for other suspected endocrine disorder: Secondary | ICD-10-CM

## 2017-11-25 DIAGNOSIS — Z124 Encounter for screening for malignant neoplasm of cervix: Secondary | ICD-10-CM

## 2017-11-25 MED ORDER — MELOXICAM 15 MG PO TABS: 15.0000 mg | ORAL_TABLET | Freq: Every day | ORAL | 0 refills | Status: DC

## 2017-11-25 NOTE — Patient Instructions (Addendum)
Try the meloxicam for the shoulder pain. If it doesn't help, please come back for additional evaluation, including an xray.    IF you received an x-ray today, you will receive an invoice from [facility]. Please contact [facility] at [phone] with questions or concerns regarding your invoice.   IF you received labwork today, you will receive an invoice from [place]. Please contact [facility] at [phone] with questions or concerns regarding your invoice.   Our billing staff will not be able to assist you with questions regarding bills from these companies.  You will be contacted with the lab results as soon as they are available. The fastest way to get your results is to activate your My Chart account. Instructions are located on the last page of this paperwork. If you have not heard from [place] regarding the results in 2 weeks, please contact this office.     Preventive Care 40-64 Years, Female Preventive care refers to lifestyle choices and visits with your health care provider that can promote health and wellness. What does preventive care include?  A yearly physical exam. This is also called an annual well check.  Dental exams once or twice a year.  Routine eye exams. Ask your health care provider how often you should have your eyes checked.  Personal lifestyle choices, including: ? Daily care of your teeth and gums. ? Regular physical activity. ? Eating a healthy diet. ? Avoiding tobacco and drug use. ? Limiting alcohol use. ? Practicing safe sex. ? Taking low-dose aspirin daily starting at age 14. ? Taking vitamin and mineral supplements as recommended by your health care provider. What happens during an annual well check? The services and screenings done by your health care provider during your annual well check will depend on your age, overall health, lifestyle risk factors, and family history of disease. Counseling Your health care provider may ask  you questions about your:  Alcohol use.  Tobacco use.  Drug use.  Emotional well-being.  Home and relationship well-being.  Sexual activity.  Eating habits.  Work and work Statistician.  Method of birth control.  Menstrual cycle.  Pregnancy history.  Screening You may have the following tests or measurements:  Height, weight, and BMI.  Blood pressure.  Lipid and cholesterol levels. These may be checked every 5 years, or more frequently if you are over 41 years old.  Skin check.  Lung cancer screening. You may have this screening every year starting at age 72 if you have a 30-pack-year history of smoking and currently smoke or have quit within the past 15 years.  Fecal occult blood test (FOBT) of the stool. You may have this test every year starting at age 15.  Flexible sigmoidoscopy or colonoscopy. You may have a sigmoidoscopy every 5 years or a colonoscopy every 10 years starting at age 39.  Hepatitis C blood test.  Hepatitis B blood test.  Sexually transmitted disease (STD) testing.  Diabetes screening. This is done by checking your blood sugar (glucose) after you have not eaten for a while (fasting). You may have this done every 1-3 years.  Mammogram. This may be done every 1-2 years. Talk to your health care provider about when you should start having regular mammograms. This may depend on whether you have a family history of breast cancer.  BRCA-related cancer screening. This may be done if you have a family history of breast, ovarian, tubal, or peritoneal cancers.  Pelvic exam and Pap test. This may be done  every 3 years starting at age 82. Starting at age 54, this may be done every 5 years if you have a Pap test in combination with an HPV test.  Bone density scan. This is done to screen for osteoporosis. You may have this scan if you are at high risk for osteoporosis.  Discuss your test results, treatment options, and if necessary, the need for more  tests with your health care provider. Vaccines Your health care provider may recommend certain vaccines, such as:  Influenza vaccine. This is recommended every year.  Tetanus, diphtheria, and acellular pertussis (Tdap, Td) vaccine. You may need a Td booster every 10 years.  Varicella vaccine. You may need this if you have not been vaccinated.  Zoster vaccine. You may need this after age 50.  Measles, mumps, and rubella (MMR) vaccine. You may need at least one dose of MMR if you were born in 1957 or later. You may also need a second dose.  Pneumococcal 13-valent conjugate (PCV13) vaccine. You may need this if you have certain conditions and were not previously vaccinated.  Pneumococcal polysaccharide (PPSV23) vaccine. You may need one or two doses if you smoke cigarettes or if you have certain conditions.  Meningococcal vaccine. You may need this if you have certain conditions.  Hepatitis A vaccine. You may need this if you have certain conditions or if you travel or work in places where you may be exposed to hepatitis A.  Hepatitis B vaccine. You may need this if you have certain conditions or if you travel or work in places where you may be exposed to hepatitis B.  Haemophilus influenzae type b (Hib) vaccine. You may need this if you have certain conditions.  Talk to your health care provider about which screenings and vaccines you need and how often you need them. This information is not intended to replace advice given to you by your health care provider. Make sure you discuss any questions you have with your health care provider. Document Released: 08/07/2015 Document Revised: 03/30/2016 Document Reviewed: 05/12/2015 Elsevier Interactive Patient Education  [employer].

## 2017-11-25 NOTE — Progress Notes (Signed)
Patient ID: Destiny Quinn, female    DOB: April 21, 1966, 52 y.o.   MRN: [id number]  PCP: Patient, No Pcp Per  Chief Complaint  Patient presents with  . Annual Exam    Subjective:   Presents for Destiny Quinn. She is accompanied by her husband, and translation is provided by Destiny Quinn, contracted translator with [employer].  The patient speaks a little [place], and her husband speaks [place]. It appears that the patient doesn't answer the questions asked, and that contributed to the complexity of today's visit.  Cervical Cancer Screening: normal cytology 10/27/2012. No previous abnormal pap. Breast Cancer Screening: normal 01/01/2016 Colorectal Cancer Screening: not yet Bone Density Testing: not yet a candidate HIV Screening: today STI Screening: declines, low risk Seasonal Influenza Vaccination: recommend annually Td/Tdap Vaccination: 10/27/2012 Pneumococcal Vaccination: not yet a candidate Zoster Vaccination: not yet Frequency of Dental evaluation: infrequently, last evaluation was about 3 years ago. Frequency of Eye evaluation: annually, in June  RIGHT elbow complaint. At work, she pushes items down a line. She feels a sharp pain that also feels numb extending from the inside of the elbow up the the shoulder. Pushing with less force doesn't help. Symptoms persist at rest. Began 03/2017. Has tried nothing to alleviate the pain. RIGHT hand dominant.   Review of Systems  Constitutional: Negative.   HENT: Negative.   Eyes: Negative.   Respiratory: Negative.   Cardiovascular: Negative.   Gastrointestinal: Negative.   Endocrine: Negative.   Genitourinary: Negative.   Musculoskeletal: Positive for arthralgias (RIGHT arm, elbow and shoulder). Negative for back pain, gait problem, joint swelling, myalgias, neck pain and neck stiffness.  Skin: Negative.   Allergic/Immunologic: Negative.   Neurological: Negative.   Hematological: Negative.   Psychiatric/Behavioral: Negative.      There are no active problems to display for this patient.   History reviewed. No pertinent past medical history.   Prior to Admission medications   Not on File    No Known Allergies  Past Surgical History:  Procedure Laterality Date  . ABDOMINAL SURGERY  1987   LEFT pelvis, fluid filled sac    History reviewed. No pertinent family history.  Social History   Socioeconomic History  . Marital status: Married    Spouse name: Not on file  . Number of children: 0  . Years of education: Not on file  . Highest education level: 12th grade  Occupational History  . Occupation: Scientist, physiological    Comment: Administrator, arts  Social Needs  . Financial resource strain: Not on file  . Food insecurity:    Worry: Not on file    Inability: Not on file  . Transportation needs:    Medical: Not on file    Non-medical: Not on file  Tobacco Use  . Smoking status: Never Smoker  . Smokeless tobacco: Never Used  Substance and Sexual Activity  . Alcohol use: Not on file  . Drug use: No  . Sexual activity: Not on file  Lifestyle  . Physical activity:    Days per week: Not on file    Minutes per session: Not on file  . Stress: Not on file  Relationships  . Social connections:    Talks on phone: Not on file    Gets together: Not on file    Attends religious service: Not on file    Active member of club or organization: Not on file    Attends meetings of clubs or organizations: Not on file  Relationship status: Not on file  Other Topics Concern  . Not on file  Social History Narrative   Came to the [place] at age 40 from [place].   Lives with her husband.        Objective:  Physical Exam  Constitutional: She is oriented to person, place, and time. Vital signs are normal. She appears well-developed and well-nourished. She is active and cooperative. No distress.  BP 130/72 (BP Location: Left Arm, Patient Position: Sitting, Cuff Size: Normal)   Pulse 66   Temp 98.6 F (37 C) (Oral)    Ht  (1.6 m)   Wt 130 lb (59 kg)   LMP 10/27/2012   SpO2 99%   BMI 23.03 kg/m    HENT:  Head: Normocephalic and atraumatic.  Right Ear: Hearing, tympanic membrane, external ear and ear canal normal. No foreign bodies.  Left Ear: Hearing, tympanic membrane, external ear and ear canal normal. No foreign bodies.  Nose: Nose normal.  Mouth/Throat: Uvula is midline, oropharynx is clear and moist and mucous membranes are normal. No oral lesions. Normal dentition. No dental abscesses or uvula swelling. No oropharyngeal exudate.  Eyes: Pupils are equal, round, and reactive to light. Conjunctivae, EOM and lids are normal. Right eye exhibits no discharge. Left eye exhibits no discharge. No scleral icterus.  Fundoscopic exam:      The right eye shows no arteriolar narrowing, no AV nicking, no exudate, no hemorrhage and no papilledema.       The left eye shows no arteriolar narrowing, no AV nicking, no exudate, no hemorrhage and no papilledema.  Neck: Trachea normal, normal range of motion and full passive range of motion without pain. Neck supple. No spinous process tenderness and no muscular tenderness present. No thyroid mass and no thyromegaly present.  Cardiovascular: Normal rate, regular rhythm, normal heart sounds, intact distal pulses and normal pulses.  Pulmonary/Chest: Effort normal and breath sounds normal. She exhibits no tenderness and no retraction. Right breast exhibits no inverted nipple, no mass, no nipple discharge, no skin change and no tenderness. Left breast exhibits no inverted nipple, no mass, no nipple discharge, no skin change and no tenderness. No breast tenderness, discharge or bleeding. Breasts are symmetrical.  Abdominal: Soft. Normal appearance and bowel sounds are normal. She exhibits no distension and no mass. There is no hepatosplenomegaly. There is no tenderness. There is no rigidity, no rebound, no guarding, no CVA tenderness, no tenderness at McBurney's point and  negative Murphy's sign. No hernia. Hernia confirmed negative in the right inguinal area and confirmed negative in the left inguinal area.  Genitourinary: Rectum normal, vagina normal and uterus normal. Rectal exam shows no external hemorrhoid and no fissure. No breast tenderness, discharge or bleeding. Pelvic exam was performed with patient supine. No labial fusion. There is no rash, tenderness, lesion or injury on the right labia. There is no rash, tenderness, lesion or injury on the left labia. Cervix exhibits no motion tenderness, no discharge and no friability. Right adnexum displays no mass, no tenderness and no fullness. Left adnexum displays no mass, no tenderness and no fullness. No erythema, tenderness or bleeding in the vagina. No foreign body in the vagina. No signs of injury around the vagina. No vaginal discharge found.  Musculoskeletal: She exhibits no edema.       Right shoulder: She exhibits tenderness (AC joint and subacromial bursa) and pain. She exhibits normal range of motion, no bony tenderness, no swelling, no effusion, no crepitus, no deformity, no  laceration, no spasm, normal pulse and normal strength.       Left shoulder: Normal.       Right elbow: Normal.      Left elbow: Normal.       Right wrist: Normal.       Left wrist: Normal.       Cervical back: Normal.       Thoracic back: Normal.       Lumbar back: Normal.       Right upper arm: Normal.       Left upper arm: Normal.       Right forearm: Normal.       Left forearm: Normal.       Right hand: Normal.       Left hand: Normal.  Lymphadenopathy:       Head (right side): No tonsillar, no preauricular, no posterior auricular and no occipital adenopathy present.       Head (left side): No tonsillar, no preauricular, no posterior auricular and no occipital adenopathy present.    She has no cervical adenopathy.    She has no axillary adenopathy.       Right: No inguinal and no supraclavicular adenopathy present.        Left: No inguinal and no supraclavicular adenopathy present.  Neurological: She is alert and oriented to person, place, and time. She has normal strength and normal reflexes. No cranial nerve deficit. She exhibits normal muscle tone. Coordination and gait normal.  Skin: Skin is warm, dry and intact. No rash noted. She is not diaphoretic. No cyanosis or erythema. Nails show no clubbing.  Psychiatric: She has a normal mood and affect. Her speech is normal and behavior is normal. Judgment and thought content normal.     Wt Readings from Last 3 Encounters:  11/25/17 130 lb (59 kg)  10/22/16 138 lb 2 oz (62.7 kg)  07/15/16 139 lb (63 kg)        Assessment & Plan:  1. Annual physical exam Age appropriate anticipatory guidance provided.  2. Screening for metabolic disorder - Urinalysis, dipstick only - Comprehensive metabolic panel  3. Screening for iron deficiency anemia - CBC with Differential/Platelet  4. Screening for thyroid disorder - TSH  5. Screening for HIV (human immunodeficiency virus) - HIV antibody  6. Need for hepatitis C screening test - Hepatitis C antibody  7. Screening for cervical cancer If cytology and HPV are both negative, repeat co-testing in 5 years. - Pap IG and HPV (high risk) DNA detection  8. Screening for colon cancer - Ambulatory referral to Gastroenterology  9. Screening for hyperlipidemia - Lipid panel  10. Chronic right shoulder pain Trial of meloxicam. If symptoms persist, return for additional evaluation including radiographs. - meloxicam (MOBIC) 15 MG tablet; Take 1 tablet (15 mg total) by mouth daily. For shoulder pain  Dispense: 30 tablet; Refill: 0    Return in about 1 year (around 11/26/2018) for Annual Exam, sooner if shoulder pain persists.   Fernande Bras, PA-C Primary Care at [facility]

## 2017-11-26 LAB — CBC WITH DIFFERENTIAL/PLATELET
BASOS ABS: 0 10*3/uL (ref 0.0–0.2)
Basos: 0 %
EOS (ABSOLUTE): 0.1 10*3/uL (ref 0.0–0.4)
Eos: 1 %
Hematocrit: 41.6 % (ref 34.0–46.6)
Hemoglobin: 12.8 g/dL (ref 11.1–15.9)
IMMATURE GRANS (ABS): 0 10*3/uL (ref 0.0–0.1)
Immature Granulocytes: 0 %
Lymphocytes Absolute: 2.5 10*3/uL (ref 0.7–3.1)
Lymphs: 50 %
MCH: 21.7 pg — ABNORMAL LOW (ref 26.6–33.0)
MCHC: 30.8 g/dL — ABNORMAL LOW (ref 31.5–35.7)
MCV: 71 fL — ABNORMAL LOW (ref 79–97)
MONOCYTES: 5 %
Monocytes Absolute: 0.3 10*3/uL (ref 0.1–0.9)
Neutrophils Absolute: 2.3 10*3/uL (ref 1.4–7.0)
Neutrophils: 44 %
Platelets: 224 10*3/uL (ref 150–379)
RBC: 5.9 x10E6/uL — AB (ref 3.77–5.28)
RDW: 17.2 % — ABNORMAL HIGH (ref 12.3–15.4)
WBC: 5.1 10*3/uL (ref 3.4–10.8)

## 2017-11-26 LAB — URINALYSIS, DIPSTICK ONLY
Bilirubin, UA: NEGATIVE
GLUCOSE, UA: NEGATIVE
Ketones, UA: NEGATIVE
Leukocytes, UA: NEGATIVE
Nitrite, UA: NEGATIVE
PH UA: 6.5 (ref 5.0–7.5)
PROTEIN UA: NEGATIVE
RBC, UA: NEGATIVE
Specific Gravity, UA: 1.014 (ref 1.005–1.030)
Urobilinogen, Ur: 0.2 mg/dL (ref 0.2–1.0)

## 2017-11-26 LAB — LIPID PANEL
CHOLESTEROL TOTAL: 233 mg/dL — AB (ref 100–199)
Chol/HDL Ratio: 3.3 ratio (ref 0.0–4.4)
HDL: 70 mg/dL (ref >39)
LDL Calculated: 149 mg/dL — ABNORMAL HIGH (ref 0–99)
TRIGLYCERIDES: 69 mg/dL (ref 0–149)
VLDL CHOLESTEROL CAL: 14 mg/dL (ref 5–40)

## 2017-11-26 LAB — COMPREHENSIVE METABOLIC PANEL
ALBUMIN: 4.7 g/dL (ref 3.5–5.5)
ALT: 18 IU/L (ref 0–32)
AST: 19 IU/L (ref 0–40)
Albumin/Globulin Ratio: 1.9 (ref 1.2–2.2)
Alkaline Phosphatase: 65 IU/L (ref 39–117)
BILIRUBIN TOTAL: 0.3 mg/dL (ref 0.0–1.2)
BUN / CREAT RATIO: 17 (ref 9–23)
BUN: 12 mg/dL (ref 6–24)
CHLORIDE: 102 mmol/L (ref 96–106)
CO2: 25 mmol/L (ref 20–29)
Calcium: 9.6 mg/dL (ref 8.7–10.2)
Creatinine, Ser: 0.7 mg/dL (ref 0.57–1.00)
GFR calc non Af Amer: 99 mL/min/{1.73_m2} (ref >59)
GFR, EST AFRICAN AMERICAN: 114 mL/min/{1.73_m2} (ref >59)
GLOBULIN, TOTAL: 2.5 g/dL (ref 1.5–4.5)
Glucose: 96 mg/dL (ref 65–99)
Potassium: 4.1 mmol/L (ref 3.5–5.2)
SODIUM: 143 mmol/L (ref 134–144)
TOTAL PROTEIN: 7.2 g/dL (ref 6.0–8.5)

## 2017-11-26 LAB — TSH: TSH: 1.82 u[IU]/mL (ref 0.450–4.500)

## 2017-11-26 LAB — HEPATITIS C ANTIBODY: Hep C Virus Ab: <0.1 s/co ratio (ref 0.0–0.9)

## 2017-11-26 LAB — HIV ANTIBODY (ROUTINE TESTING W REFLEX): HIV Screen 4th Generation wRfx: NONREACTIVE

## 2017-12-01 LAB — PAP IG AND HPV HIGH-RISK: PAP Smear Comment: 0

## 2017-12-01 LAB — HPV, LOW VOLUME (REFLEX): HPV, LOW VOL REFLEX: NEGATIVE

## 2017-12-28 ENCOUNTER — Other Ambulatory Visit: Payer: Self-pay

## 2017-12-28 ENCOUNTER — Ambulatory Visit (INDEPENDENT_AMBULATORY_CARE_PROVIDER_SITE_OTHER): Payer: PRIVATE HEALTH INSURANCE | Admitting: Physician Assistant

## 2017-12-28 ENCOUNTER — Encounter: Payer: Self-pay | Admitting: Physician Assistant

## 2017-12-28 ENCOUNTER — Ambulatory Visit (INDEPENDENT_AMBULATORY_CARE_PROVIDER_SITE_OTHER): Payer: PRIVATE HEALTH INSURANCE

## 2017-12-28 VITALS — BP 106/76 | HR 62 | Temp 99.2°F | Resp 16 | Ht 63.47 in | Wt 130.8 lb

## 2017-12-28 DIAGNOSIS — M25511 Pain in right shoulder: Secondary | ICD-10-CM

## 2017-12-28 DIAGNOSIS — G8929 Other chronic pain: Secondary | ICD-10-CM | POA: Diagnosis not present

## 2017-12-28 DIAGNOSIS — M5030 Other cervical disc degeneration, unspecified cervical region: Secondary | ICD-10-CM | POA: Diagnosis not present

## 2017-12-28 DIAGNOSIS — R29898 Other symptoms and signs involving the musculoskeletal system: Secondary | ICD-10-CM

## 2017-12-28 DIAGNOSIS — E785 Hyperlipidemia, unspecified: Secondary | ICD-10-CM | POA: Insufficient documentation

## 2017-12-28 DIAGNOSIS — R718 Other abnormality of red blood cells: Secondary | ICD-10-CM | POA: Insufficient documentation

## 2017-12-28 NOTE — Patient Instructions (Addendum)
I will let you know when I get the results of the lab tests.  Please take an OTC Fish Oil supplement to lower the cholesterol. I recommend 1000-2000 mg daily.    IF you received an x-ray today, you will receive an invoice from [facility]. Please contact [facility] at [phone] with questions or concerns regarding your invoice.   IF you received labwork today, you will receive an invoice from [facility]. Please contact [facility] at [phone] with questions or concerns regarding your invoice.   Our billing staff will not be able to assist you with questions regarding bills from these companies.  You will be contacted with the lab results as soon as they are available. The fastest way to get your results is to activate your My Chart account. Instructions are located on the last page of this paperwork. If you have not heard from [place] regarding the results in 2 weeks, please contact this office.     Ch? ?? ?n h?n ch? ch?t be?o va? cholesterol Fat and Cholesterol Restricted Diet N?ng ?? ch?t bo v cholesterol cao trong mu cu?a quy? vi? c th? d?n ??n cc v?n ?? s?c kh?e khc nhau, ch?ng h?n nh? cc b?nh v? tim, m?ch mu, ti m?t, gan v tuy?n t?y. Ch?t bo l ca?c ngu?n n?ng l??ng t?p trung t?n t?i ? nhi?u d?ng khc nhau. M?t s? lo?i ch?t bo nh?t ??nh, bao g?m ch?t bo bo ha, c th? gy h?i khi th??a. Cholesterol l m?t ch?t m c? th? c?n ??n v?i m?t l??ng nh?. C? th? c?a quy? vi? ta?o ra t?t c? cholesterol c?n thi?t. Cholesterol d? th?a do th?c ph?m quy? vi? ?n. Khi quy? vi? c n?ng ?? cholesterol v ch?t bo bo ha cao trong mu, cc v?n ?? s?c kh?e c th? pht sinh v ch?t bo v cholesterol d? th??a s? ti?ch tu? d?c theo tha?nh cc m?ch mu, khi?n cc m?ch mu ? bi? he?p la?i. L?a ch?n ?ng lo?i th?c ph?m s? gip quy? vi? ki?m sot l??ng ch?t bo v cholesterol ?n va?o. ?i?u ny s? gip gi? cho n?ng ?? cc ch?t na?y trong mu c?a quy? vi? n?m trong gi?i h?n bnh th??ng  v lm gi?m nguy c? m?c b?nh. K? ho?ch c?a ti l g? Chuyn gia ch?m [place] s?c kh?e c th? khuy?n ngh? quy? vi?:  H?n ch? l??ng ch?t bo tiu th? ?? m??c t? ______% tr? xu?ng theo t?ng l??ng calo m?i ngy.  H?n ch? l??ng cholesterol trong ch? ?? ?n c?a quy? vi? ?? m??c d??i _________mg m?i ngy.  ?n t? 20-30 gam ch?t x? m?i ngy.  Ti nn ch?n nh?ng lo?i ch?t bo no?  Ch?n cc ch?t bo t?t cho s?c kh?e th??ng xuyn h?n. Ch?n ch?t bo khng bo ha ??n v ch?t be?o khng ba?o ho?a ?a, ch?ng h?n nh? d?u  liu v d?u canola, h?t lanh, qu? c ch, h?nh nhn v cc lo?i h?t.  ?n thm ch?t bo omega-3. Nh??ng l?a ch?n h??p ly? bao g?m c h?i, c thu, c mi, c ng?, d?u h?t lanh va? h?t lanh nghi?n. ???t mu?c tiu ?n c t nh?t hai l?n m?t tu?n.  H?n ch? cc ch?t bo bo ha. Ch?t bo bo ha ch? y?u ???c tm th?y trong cc s?n ph?m ??ng v?t, nh? th?t, b? v kem. Ca?c ngu?n ch?t bo bo ha t?? th??c v?t bao g?m d?u c?, d?u h?t c? v d?u d?a.  Trnh cc th?c ph?m co? ch??a cc lo?i d?u  hydro ha m?t ph?n. Nh?ng th?c ph?m ny ch?a ch?t bo chuy?n ha. V d? v? th?c ph?m ch?a ch?t bo chuy?n ho?a l b? th?c v?t, m?t s? b? th?c v?t ?o?ng h?p, bnh quy, bnh quy gin v bnh n??ng khc. Nh?ng nguyn t?c chung ti c?n tun theo l g? Nh?ng h??ng d?n cho vi?c ?n u?ng lnh m?nh na?y s? gip quy? vi? ki?m sot l??ng ch?t be?o v cholesterol ?n va?o:  Ki?m tra nhn th?c ph?m c?n th?n ?? nh?n bi?t th?c ph?m c ch?a ch?t bo chuy?n ha ho?c c hm l??ng ch?t bo bo ha cao.  Cho rau v sa lt rau xanh vo m?t n?a ??a ??ng th?c ?n.  Cho ngu? c?c nguyn ca?m va?o m?t ph?n t? ??a. Hy tm t?? "whole" (nguyn ca?m) l t? ??u tin trong danh sch thnh ph?n th?c ?n.  Cho th?c ?n c protein thi?t na?c va?o m?t ph?n t? ??a.  H?n ch? tri cy ?? m??c hai b?a m?t ngy. Ch?n tri cy thay v n??c tri cy.  ?n nhi?u th?c ph?m c ch?a ch?t x? nh? to, bng c?i xanh, c r?t, ??u, ??u H-Lan v ??i  m?ch.  ?n nhi?u th?c ?n n?u t?i nh v i?t th??c ?n ?? nh hng, th??c ?n t? ch?n v th?c ?n nhanh.  H?n ch? ho?c trnh u?ng r??u.  Ha?n ch? th?c ph?m co? nhi?u tinh b?t v ???ng.  H?n ch? th?c ph?m chin.  N?u ?n b?ng cc ph??ng php khc thay vi? chin. Bo? lo?, lu?c, n??ng v hun ??u l nh??ng l?a ch?n tuy?t v?i.  Gi?m cn n?u qu v? th?a cn. Gia?m ch? 5-10% cn n?ng c? th? ban ??u c th? gip i?ch cho s?c kh?e t?ng th? c?a quy? vi? v ng?n ng?a cc b?nh nh? ti?u ???ng v b?nh tim.  Ti c th? ?n nh?ng th?c ph?m no? Ng? c?c nguyn h?t  Ng? c?c nguyn ca?m nh? la m nguyn ca?m ho??c ho?c bnh m nguyn ca?m, bnh quy gin, ng? c?c v m ?ng. B?t y?n m?ch khng ???ng, mi? bulgur, la m?ch, dim ma?ch (quinoa) ho?c g?o l??t. Ng ho?c bnh b?t m nguyn ca?m. Derrek Gu c?  Derrek Gu c? t??i ho?c ?ng l?nh (ch?a ch? bi?n, h?p, bo? lo? ho?c n??ng). Ferne Coe tr?n. Tri cy  T?t c? cc lo?i tra?i cy t??i, ?ng h?p (d???i da?ng n??c p t? nhin) ho?c ?ng l?nh. Th?t v nh?ng ngu?n th?c ph?m ch?a protein khc  Th?t b xay (85% ho?c na?c h?n), thi?t bo? ?n c? ho?c th?t b c?t bo? m??. Ga? ho??c ga? ty bo? da. G ho?c g ty xay. Th?t l??n c??t bo? m??. T?t c? c v h?i s?n. Tr?ng. Cc lo?i ??u, ??u H Lan ho??c ??u l?ng s?y kh. Cc lo?i qu? h?ch v h?t khng ??p mu?i. ??u ?ng h?p ho??c ??u s?y kh khng ??p mu?i. B? s?a  Cc s?n ph?m t? s?a t bo nh? s?a g?y ho?c s?a 1%, 2% ho?c pho mt t bo, ricotta i?t bo ho?c pho mt t??i ho?c s?a chua t bo. M? v d?u  B? th?c v?t ?ng h?p khng c ch?t bo chuy?n ha. Mayonnaise v n???c tr?n rau tr?n nh? ho??c i?t ch?t be?o. Qu? b?. D?u  liu, d?u canola, d?u m ho??c d?u hoa rum. B? ??u ph?ng ho?c h?nh nhn thin nhin (ch?n nh?ng loa?i khng co? thm ???ng v d?u). Nh?ng th?c ph?m li?t k ? trn c th? khng ph?i l m?t danh m?c ??y ??  cc th?c ph?m v ?? u?ng ???c khuy?n ngh?Juel Burrow. Lin h? v?i chuyn gia dinh d??ng ?? c thm s? l?a ch?n. Cc  th?c ph?m c?n trnh Ng? c?c nguyn h?t  Bnh m tr?ng. M tr?ng. G?o tr?ng. Bnh m ng. Bnh m trn, bnh ng?t v bnh s?ng b. Bnh quy gin ch?a ch?t bo chuy?n ha. Derrek GuRau c?  Khoai ty tr?ng. Richrd SoxNg. Rau tr?n kem ho??c rau xa?o. Cc lo?i rau tr?n n??c s?t pho mt. Tri cy  Tri cy s?y kh. Tri cy ?ng h?p ngm xi-r loa?ng ho??c ???c. N??c p tri cy. Th?t v nh?ng ngu?n th?c ph?m ch?a protein khc  La?t thi?t m??. X??ng s??n, cnh g, th?t xng khi, xc xch, xc xch hun khi, xc xch Y?, lo?ng l??n, m? l?ng l??n, xc xch hot dog, xc xi?ch ?? ra?n v th?t ?n tr?a ?ng gi. Gan v thi?t n?i ta?ng. B? s?a  S??a nguyn kem ho??c 2%, kem, h?n h??p s??a nguyn kem va? kem t??i v pho mt kem. Pho mt s?a nguyn kem. S??a chua nguyn ch?t bo ho?c s?a chua co? ????ng. Pho mt nguyn ch?t bo. B?t kem khng s??a v l??p phu? kem ?a? ?a?nh bng. Pho mt ch? bi?n, ph?t pho mt, ho??c s??a ?ng pho mt. ?? u?ng  R??u. ?? u?ng c ???ng (nh? soda, n??c chanh v n??c tri cy ho?c r???u pn). M? v d?u  B?, b? th?c v?t da?ng tho?i, m? l?n, m?? pha ba?nh, b? s?a tru l?ng ho?c ch?t bo th?t xng khi. D?u d?a, h?t c?, ho?c c?. K?o v ?? trng mi?ng  Xi-r ng, ???ng, m?t ong v m?t ????ng. K?o. M??t v th?ch. Xi r. Ngu? c?c co? ????ng. Ba?nh quy, bnh n??ng, bnh ng?t, bnh rn, bnh n??ng x?p v kem. Nh?ng th?c ph?m li?t k ? trn c th? khng ph?i l m?t danh m?c ??y ?? cc th?c ph?m v ?? u?ng c?n trnh. Lin h? v?i chuyn gia dinh d??ng ?? c thm thng tin. Thng tin ny khng nh?m m?c ?ch thay th? cho l?i khuyn m chuyn gia ch?m [place] s?c kh?e ni v?i qu v?. Hy b?o ??m qu v? ph?i th?o lu?n b?t k? v?n ?? g m qu v? c v?i chuyn gia ch?m [place] s?c kh?e c?a qu v?. Document Released: 11/02/2015 Document Revised: 07/29/2016 Document Reviewed: 10/09/2013 Elsevier Interactive Patient Education  2018 [employer]

## 2017-12-28 NOTE — Progress Notes (Signed)
Patient ID: Destiny Quinn, female    DOB: 04-20-1966, 52 y.o.   MRN: [id number]  PCP: Patient, No Pcp Per  Chief Complaint  Patient presents with  . Shoulder Pain    not any better medication isn't helping     Subjective:   Presents for evaluation of persistent shoulder pain.  I saw her last month for an annual exam. At that time, she complained of right arm pain.  She described it as a sharp pain extending from the inside of the right elbow up to the shoulder.  Also experienced some tingling sensation.  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  Pushing more gently did not lessen the pain.  Symptoms persisted at rest.  She had tried nothing to alleviate the pain, which began in 03/2017.  She is right-hand dominant.  She was prescribed meloxicam and advised to return for reevaluation if symptoms persisted. Shoulder pain is no better with meloxicam. Now reports right hand weakness.  Not dropping things.  Routine labs drawn at her visit revealed slightly elevated red blood cells, and microcytosis.  Hemoglobin was 12.8 and hematocrit 41.6.  Total cholesterol 233, triglycerides 69, HDL 70, LDL 149.  Renal and hepatic function tests were normal.  TSH normal.  HIV and hepatitis C antibodies negative.   Review of Systems As above.    Patient Active Problem List   Diagnosis Date Noted  . RBC microcytosis 12/28/2017  . Hyperlipidemia 12/28/2017     Prior to Admission medications   Medication Sig Start Date End Date Taking? Authorizing Provider  meloxicam (MOBIC) 15 MG tablet Take 1 tablet (15 mg total) by mouth daily. For shoulder pain Patient not taking: Reported on 12/28/2017 11/25/17   Porfirio OarJeffery, Orby Tangen, PA-C     No Known Allergies     Objective:  Physical Exam  Constitutional: She is oriented to person, place, and time. She appears well-developed and well-nourished. She is active and cooperative. No distress.  BP 106/76   Pulse 62   Temp 99.2 F (37.3 C)    Resp 16   Ht 5' 3.47" (1.612 m)   Wt 130 lb 12.8 oz (59.3 kg)   LMP 10/27/2012   SpO2 100%   BMI 22.83 kg/m    Eyes: Conjunctivae are normal.  Pulmonary/Chest: Effort normal.  Musculoskeletal:       Right shoulder: Normal.       Left shoulder: Normal.       Right elbow: Normal.      Left elbow: Normal.       Right wrist: Normal.       Left wrist: Normal.       Cervical back: Normal.       Right upper arm: Normal.       Left upper arm: Normal.  Neurological: She is alert and oriented to person, place, and time.  Psychiatric: She has a normal mood and affect. Her speech is normal and behavior is normal.       Dg Cervical Spine Complete  Result Date: 12/28/2017 CLINICAL DATA:  Fatigue, weakness in the right arm and hand EXAM: CERVICAL SPINE - COMPLETE 4+ VIEW COMPARISON:  None. FINDINGS: Degenerative disc disease changes at C4-5 and C5-6 with disc space narrowing and spurring. Bilateral neural foraminal narrowing at C5-6 due to facet disease and uncovertebral spurring. Normal alignment. No fracture. Prevertebral soft tissues are normal. IMPRESSION: Degenerative disc and facet disease as above with mild-to-moderate bilateral neural foraminal  narrowing at C5-6. No acute bony abnormality. Electronically Signed   By: Charlett Nose M.D.   On: 12/28/2017 11:01       Assessment & Plan:   Problem List Items Addressed This Visit    RBC microcytosis - Primary    Add iron studies and peripheral smear today.  She is asymptomatic.      Relevant Orders   CBC with Differential/Platelet   Iron, TIBC and Ferritin Panel (Completed)   Pathologist smear review   Hyperlipidemia    Recommend healthy eating, regular exercise.  Consider OTC fish oil supplement.      DDD (degenerative disc disease), cervical    Manifests as shoulder and arm pain/paresthesias.  No neck pain.  Refer to orthopedics for additional evaluation and treatment.      Relevant Orders   Ambulatory referral to Spine  Surgery    Other Visit Diagnoses    Chronic right shoulder pain       Relevant Orders   Ambulatory referral to Spine Surgery   Weakness of right hand       Relevant Orders   DG Cervical Spine Complete (Completed)   Ambulatory referral to Spine Surgery       Return in about 2 months (around 02/27/2018) for re-evaluation of cholesterol and microcytosis.   Fernande Bras, PA-C Primary Care at [facility]

## 2017-12-30 DIAGNOSIS — M5030 Other cervical disc degeneration, unspecified cervical region: Secondary | ICD-10-CM | POA: Insufficient documentation

## 2017-12-30 LAB — PATHOLOGIST SMEAR REVIEW
BASOS: 0 %
Basophils Absolute: 0 10*3/uL (ref 0.0–0.2)
EOS (ABSOLUTE): 0.1 10*3/uL (ref 0.0–0.4)
EOS: 1 %
HEMATOCRIT: 41.3 % (ref 34.0–46.6)
Hemoglobin: 12.4 g/dL (ref 11.1–15.9)
IMMATURE GRANS (ABS): 0 10*3/uL (ref 0.0–0.1)
IMMATURE GRANULOCYTES: 0 %
LYMPHS ABS: 2.1 10*3/uL (ref 0.7–3.1)
Lymphs: 45 %
MCH: 21.6 pg — AB (ref 26.6–33.0)
MCHC: 30 g/dL — AB (ref 31.5–35.7)
MCV: 72 fL — ABNORMAL LOW (ref 79–97)
MONOS ABS: 0.2 10*3/uL (ref 0.1–0.9)
Monocytes: 5 %
NEUTROS ABS: 2.3 10*3/uL (ref 1.4–7.0)
NEUTROS PCT: 49 %
PATH REV PLTS: NORMAL
Path Rev WBC: NORMAL
Platelets: 214 10*3/uL (ref 150–450)
RBC: 5.74 x10E6/uL — ABNORMAL HIGH (ref 3.77–5.28)
RDW: 16.9 % — ABNORMAL HIGH (ref 12.3–15.4)
WBC: 4.7 10*3/uL (ref 3.4–10.8)

## 2017-12-30 LAB — IRON,TIBC AND FERRITIN PANEL
Ferritin: 70 ng/mL (ref 15–150)
IRON SATURATION: 20 % (ref 15–55)
IRON: 67 ug/dL (ref 27–159)
Total Iron Binding Capacity: 340 ug/dL (ref 250–450)
UIBC: 273 ug/dL (ref 131–425)

## 2017-12-30 NOTE — Assessment & Plan Note (Signed)
Recommend healthy eating, regular exercise.  Consider OTC fish oil supplement.

## 2017-12-30 NOTE — Assessment & Plan Note (Signed)
Add iron studies and peripheral smear today.  She is asymptomatic.

## 2017-12-30 NOTE — Assessment & Plan Note (Signed)
Manifests as shoulder and arm pain/paresthesias.  No neck pain.  Refer to orthopedics for additional evaluation and treatment.

## 2018-01-01 ENCOUNTER — Encounter: Payer: Self-pay | Admitting: *Deleted

## 2018-01-01 ENCOUNTER — Encounter: Payer: Self-pay | Admitting: Physician Assistant

## 2018-01-05 ENCOUNTER — Ambulatory Visit (INDEPENDENT_AMBULATORY_CARE_PROVIDER_SITE_OTHER): Payer: PRIVATE HEALTH INSURANCE | Admitting: Orthopaedic Surgery

## 2018-01-05 ENCOUNTER — Encounter (INDEPENDENT_AMBULATORY_CARE_PROVIDER_SITE_OTHER): Payer: Self-pay | Admitting: Orthopaedic Surgery

## 2018-01-12 ENCOUNTER — Encounter (INDEPENDENT_AMBULATORY_CARE_PROVIDER_SITE_OTHER): Payer: Self-pay | Admitting: Physician Assistant

## 2018-01-12 ENCOUNTER — Ambulatory Visit (INDEPENDENT_AMBULATORY_CARE_PROVIDER_SITE_OTHER): Payer: PRIVATE HEALTH INSURANCE | Admitting: Physician Assistant

## 2018-01-12 DIAGNOSIS — M542 Cervicalgia: Secondary | ICD-10-CM

## 2018-01-12 MED ORDER — PREDNISONE 10 MG (21) PO TBPK: ORAL_TABLET | ORAL | 0 refills | Status: AC

## 2018-01-12 NOTE — Progress Notes (Signed)
   Office Visit Note   Patient: Destiny Quinn           Date of Birth: [birth year]           MRN: [id number] Visit Date: 01/12/2018              Requested by: Porfirio OarJeffery, Chelle, PA-C No address on file PCP: Patient, No Pcp Per   Assessment & Plan: Visit Diagnoses:  1. 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  I will call in a steroid taper at this point as well as send the patient to outpatient physical therapy for the above condition.  A prescription was given to the patient for this.  She will follow-up with us as needed.  Call with concerns or questions in the meantime.  Follow-Up Instructions: Return if symptoms worsen or fail to improve.   Orders:  No orders of the defined types were placed in this encounter.  No orders of the defined types were placed in this encounter.     Procedures: No procedures performed   Clinical Data: No additional findings.   Subjective: Chief Complaint  Patient presents with  . Right Shoulder - Pain  . Neck - Pain  . Right Hand - Pain    HPI patient is a pleasant 52 year old female who presents to our clinic today with right sided neck pain radiating down to her right hand.  She is here with an interpreter.  This is been ongoing for the past year without any known injury or change in activity.  The pain she has is constant.  Pain is worse utilizing her right upper extremity.  She does have associated weakness.  She also notes tingling all 5 fingers.  She has not taken any medication for this.  No previous right shoulder or cervical spine pathology.  Her PCP recently ordered an x-ray of her cervical spine which showed mild to moderate bilateral neural foraminal narrowing at C5-6.  Review of Systems as detailed in HPI.  All others reviewed and are negative.   Objective: Vital Signs: LMP 10/27/2012   Physical Exam well-developed and well-nourished female no acute distress.  Alert and  oriented x3.  Ortho Exam examination of right shoulder reveals full active range of motion all directions.  Negative empty can and cross body abduction.  Full range of motion of her cervical spine.  She does have slight paraspinous tenderness on the right.  Specialty Comments:  No specialty comments available.  Imaging: No new imaging today   PMFS History: Patient Active Problem List   Diagnosis Date Noted  . Cervicalgia 01/12/2018  . DDD (degenerative disc disease), cervical 12/30/2017  . RBC microcytosis 12/28/2017  . Hyperlipidemia 12/28/2017   History reviewed. No pertinent past medical history.  History reviewed. No pertinent family history.  Past Surgical History:  Procedure Laterality Date  . ABDOMINAL SURGERY  1987   LEFT pelvis, fluid filled sac   Social History   Occupational History  . Occupation: Scientist, physiologicalline worker    Comment: Administrator, [employer]  Tobacco Use  . Smoking status: Never Smoker  . Smokeless tobacco: Never Used  Substance and Sexual Activity  . Alcohol use: Not on file  . Drug use: No  . Sexual activity: Not on file

## 2018-02-08 ENCOUNTER — Other Ambulatory Visit: Payer: Self-pay | Admitting: Family Medicine

## 2018-02-09 ENCOUNTER — Other Ambulatory Visit: Payer: Self-pay | Admitting: Physician Assistant

## 2018-02-09 ENCOUNTER — Other Ambulatory Visit: Payer: Self-pay | Admitting: Family Medicine

## 2018-02-09 DIAGNOSIS — Z1231 Encounter for screening mammogram for malignant neoplasm of breast: Secondary | ICD-10-CM

## 2018-03-13 ENCOUNTER — Ambulatory Visit
Admission: RE | Admit: 2018-03-13 | Discharge: 2018-03-13 | Disposition: A | Discharge status: Home / Self Care | Payer: No Typology Code available for payment source | Source: Ambulatory Visit | Attending: Family Medicine | Admitting: Family Medicine

## 2018-03-13 DIAGNOSIS — Z1231 Encounter for screening mammogram for malignant neoplasm of breast: Secondary | ICD-10-CM

## 2020-06-04 IMAGING — MG DIGITAL SCREENING BILATERAL MAMMOGRAM WITH TOMO AND CAD
8 series · 9 of 24 positions shown · non-contrast
Comparison: Previous exam(s).

CLINICAL DATA: Screening.

EXAM:
DIGITAL SCREENING BILATERAL MAMMOGRAM WITH TOMO AND CAD

[L MLO synth-2D]
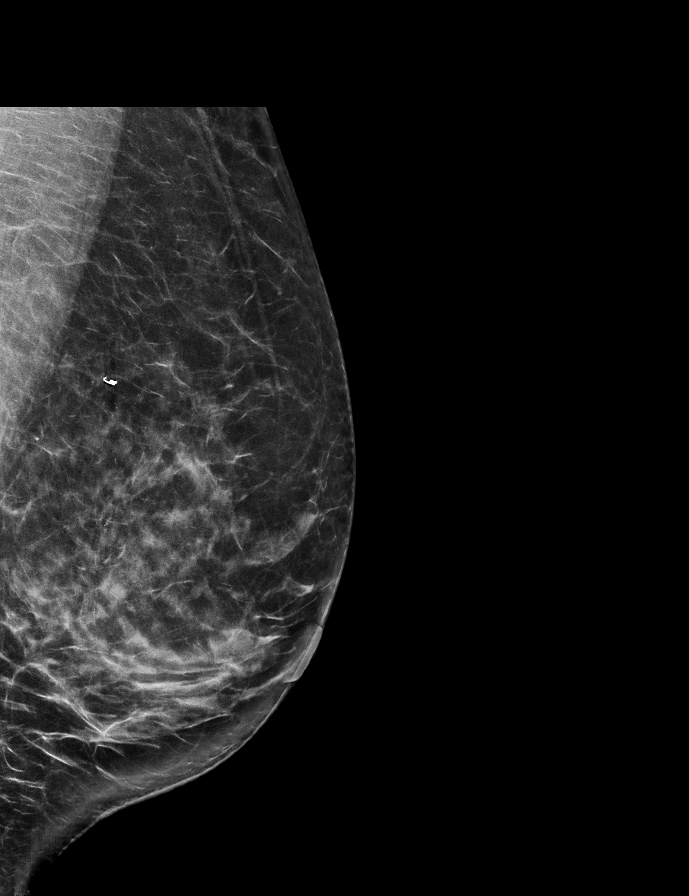

[R MLO synth-2D]
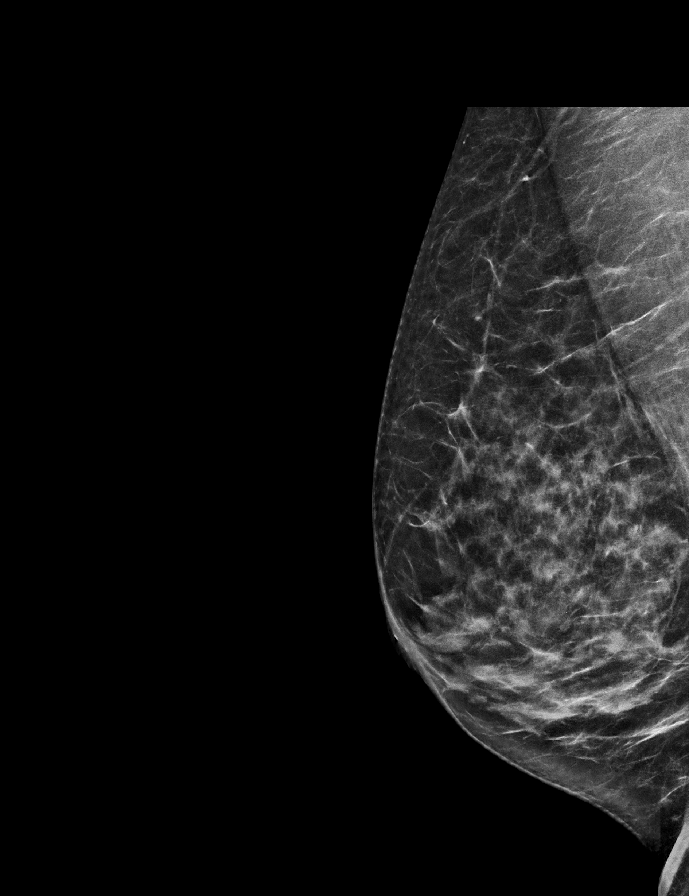

[R CC synth-2D]
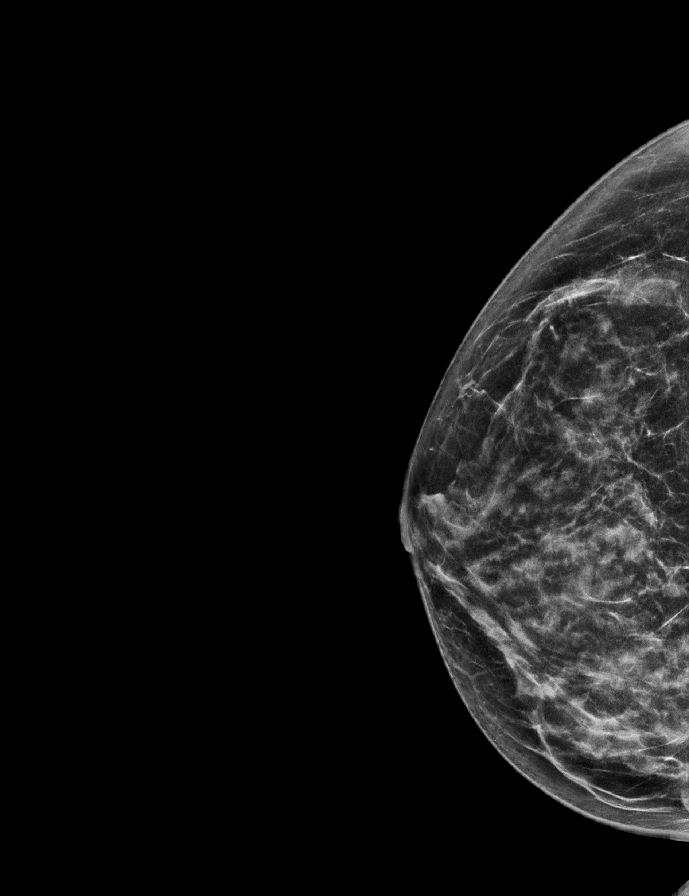

[L CC synth-2D]
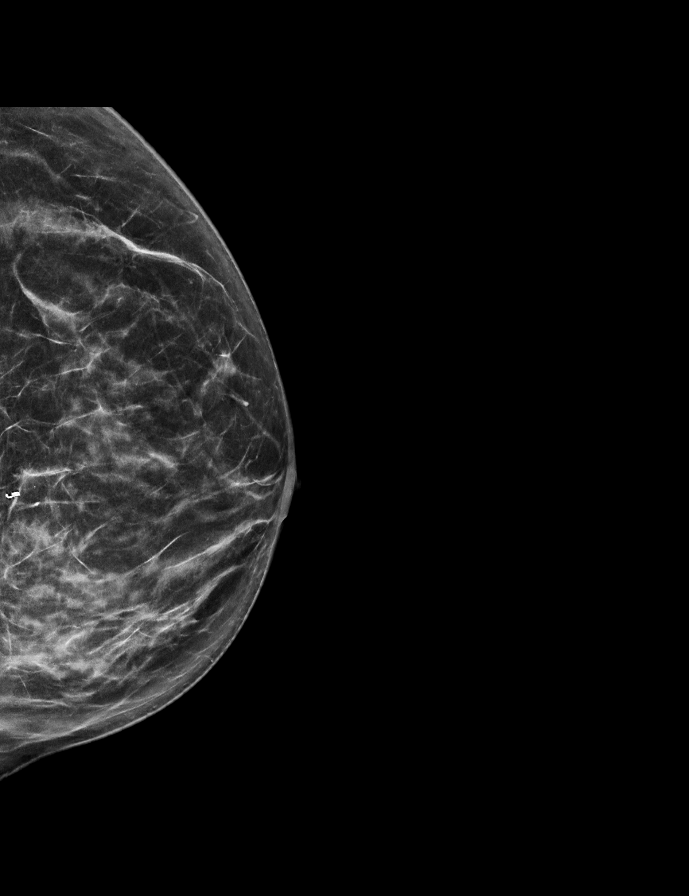

[R CC tomo · 2 of 56 frames shown]
[frame 19/56]
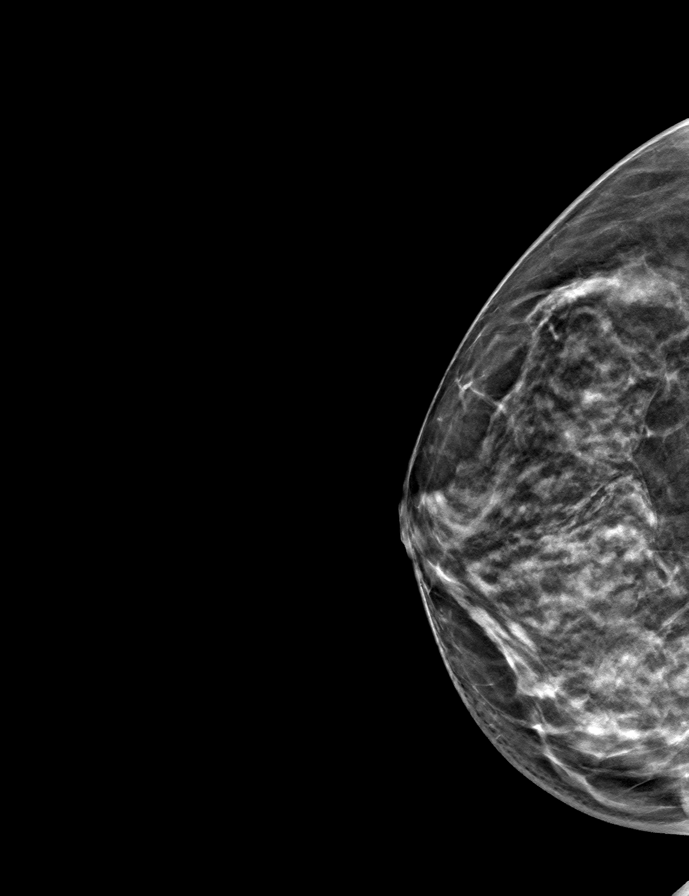
[frame 29/56]
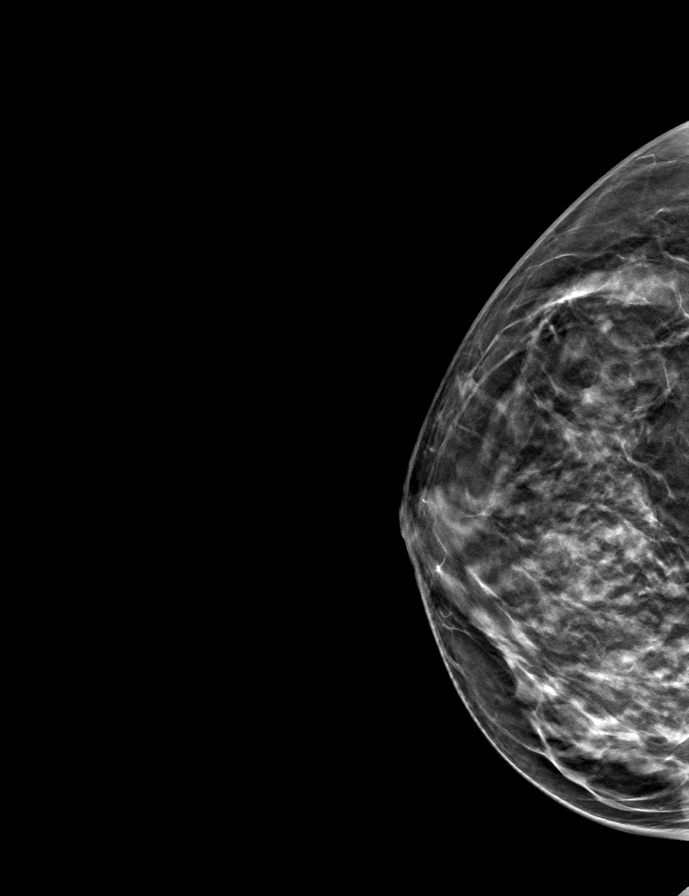

[L CC tomo · tomo slice 31/61.0]
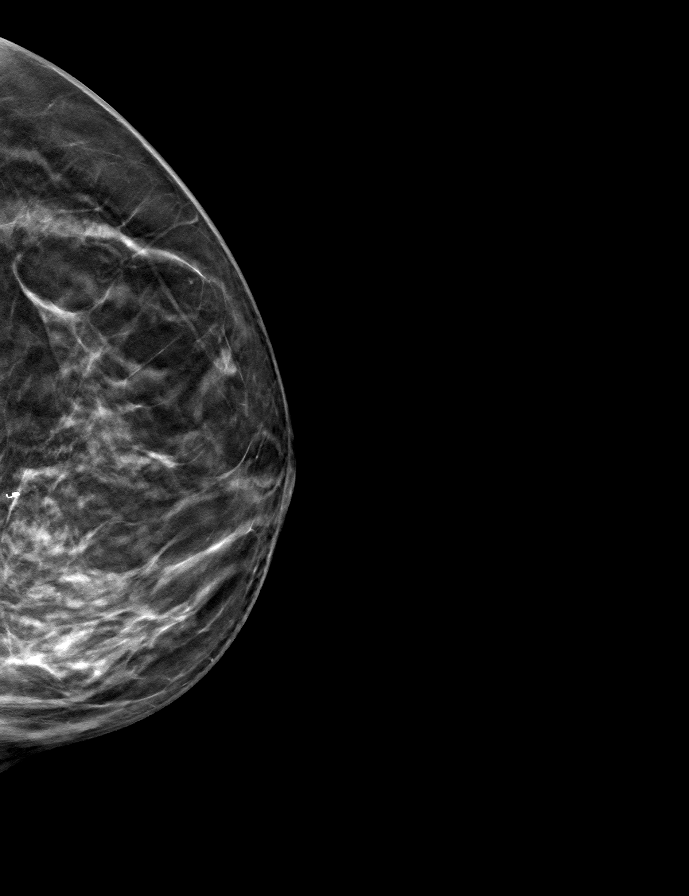

[R MLO tomo · tomo slice 29/57.0]
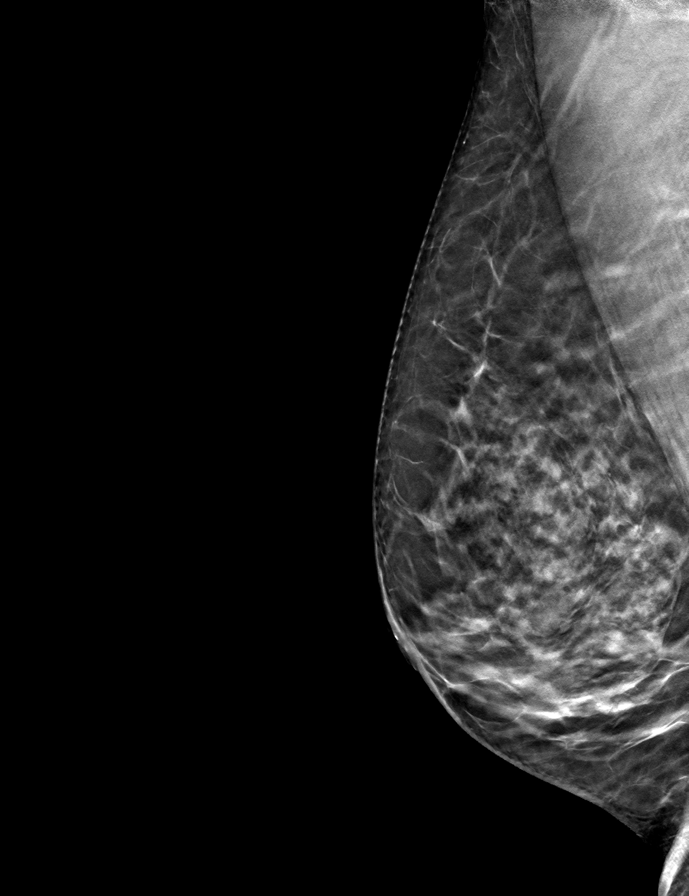

[L MLO tomo · tomo slice 33/65.0]
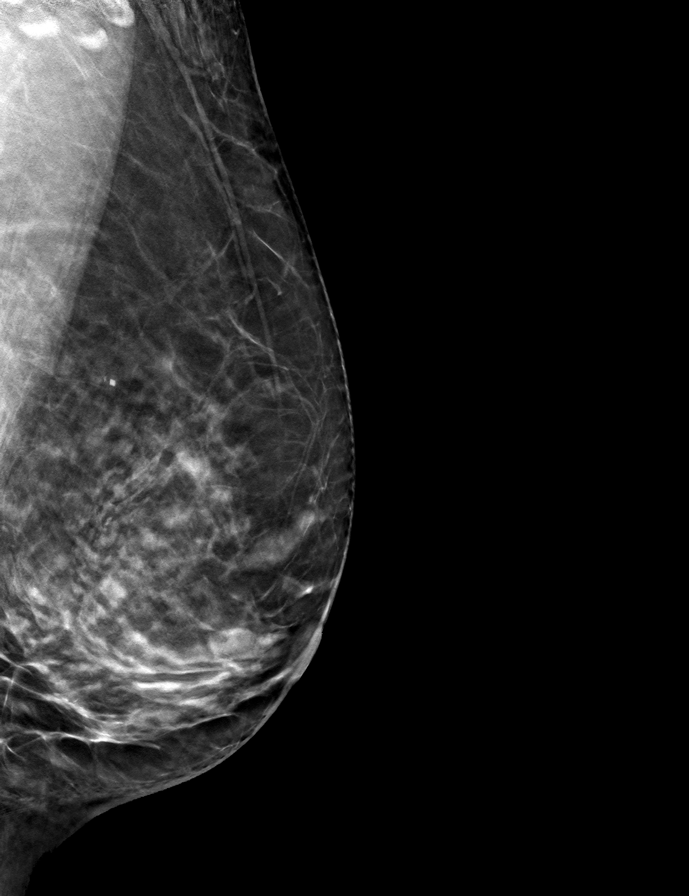

[9 of 24 positions shown; findings below may reference images not displayed]

ACR Breast Density Category c: The breast tissue is heterogeneously
dense, which may obscure small masses.
FINDINGS: There are no findings suspicious for malignancy. Images were
processed with CAD.
IMPRESSION: No mammographic evidence of malignancy. A result letter of this
screening mammogram will be mailed directly to the patient.

RECOMMENDATION:
Screening mammogram in one year. (Code:FT-U-LHB)

BI-RADS CATEGORY  1: Negative.

## 2021-03-23 ENCOUNTER — Other Ambulatory Visit: Payer: Self-pay | Admitting: Internal Medicine

## 2021-03-23 DIAGNOSIS — Z1231 Encounter for screening mammogram for malignant neoplasm of breast: Secondary | ICD-10-CM

## 2021-10-19 ENCOUNTER — Ambulatory Visit (INDEPENDENT_AMBULATORY_CARE_PROVIDER_SITE_OTHER): Payer: Self-pay | Admitting: Registered Nurse

## 2021-10-19 ENCOUNTER — Other Ambulatory Visit: Payer: Self-pay

## 2021-10-19 ENCOUNTER — Encounter: Payer: Self-pay | Admitting: Registered Nurse

## 2021-10-19 VITALS — BP 132/75 | HR 71 | Temp 98.0°F | Resp 18 | Ht 63.0 in | Wt 126.6 lb

## 2021-10-19 DIAGNOSIS — Z021 Encounter for pre-employment examination: Secondary | ICD-10-CM

## 2021-10-19 NOTE — Progress Notes (Signed)
? ?New Patient Office Visit ? ?Subjective:  ?Patient ID: Destiny Quinn, female    DOB: [birth year]  Age: 56 y.o. MRN: [id number] ? ?CC:  ?Chief Complaint  ?Patient presents with  ? New Patient (Initial Visit)  ?  Patient states she has been out of the country for 6 weeks and has come back and need blood work done due to employer  ? ?Destiny Quinn: #[id number] ?Video interpreter.  ? ?HPI ?Destiny Quinn presents to establish care ? Visit to est care ?Out of country x 6 weeks, needs drug testing pre employment screen ?Unsure where to get this done.  ?Works at [employer] in [place].  ? ?Histories reviewed and updated with patient.  ? ?Outpatient Encounter Medications as of 10/19/2021  ?Medication Sig  ? predniSONE (STERAPRED UNI-PAK 21 TAB) 10 MG (21) TBPK tablet Take as directed (Patient not taking: Reported on 10/19/2021)  ? ?No facility-administered encounter medications on file as of 10/19/2021.  ? ? ?History reviewed. No pertinent past medical history. ? ?Past Surgical History:  ?Procedure Laterality Date  ? ABDOMINAL SURGERY  1987  ? LEFT pelvis, fluid filled sac  ? ? ?History reviewed. No pertinent family history. ? ?Social History  ? ?Socioeconomic History  ? Marital status: Married  ?  Spouse name: Not on file  ? Number of children: 0  ? Years of education: Not on file  ? Highest education level: 12th grade  ?Occupational History  ? Occupation: Scientist, physiological  ?  Comment: [employer]  ?Tobacco Use  ? Smoking status: Never  ? Smokeless tobacco: Never  ?Vaping Use  ? Vaping Use: Never used  ?Substance and Sexual Activity  ? Alcohol use: Not Currently  ? Drug use: No  ? Sexual activity: Not Currently  ?  Birth control/protection: Post-menopausal  ?Other Topics Concern  ? Not on file  ?Social History Narrative  ? Came to the [place] at age 71 from [place].  ? Lives with her husband.  ? ?Social Determinants of Health  ? ?Financial Resource Strain: Not on file  ?Food Insecurity: Not on file  ?Transportation Needs: Not on file  ?Physical  Activity: Not on file  ?Stress: Not on file  ?Social Connections: Not on file  ?Intimate Partner Violence: Not on file  ? ? ?ROS ?Review of Systems  ?Constitutional: Negative.   ?HENT: Negative.    ?Eyes: Negative.   ?Respiratory: Negative.    ?Cardiovascular: Negative.   ?Gastrointestinal: Negative.   ?Genitourinary: Negative.   ?Musculoskeletal: Negative.   ?Skin: Negative.   ?Neurological: Negative.   ?Psychiatric/Behavioral: Negative.    ?All other systems reviewed and are negative. ? ?Objective:  ? ?Today's Vitals: BP 132/75   Pulse 71   Temp 98 ?F (36.7 ?C) (Temporal)   Resp 18   Ht 5\' 3"  (1.6 m)   Wt 126 lb 9.6 oz (57.4 kg)   LMP 10/27/2012   SpO2 99%   BMI 22.43 kg/m?  ? ?Physical Exam ?Vitals and nursing note reviewed.  ?Constitutional:   ?   General: She is not in acute distress. ?   Appearance: Normal appearance. She is normal weight. She is not ill-appearing, toxic-appearing or diaphoretic.  ?Cardiovascular:  ?   Rate and Rhythm: Normal rate and regular rhythm.  ?   Heart sounds: Normal heart sounds. No murmur heard. ?  No friction rub. No gallop.  ?Pulmonary:  ?   Effort: Pulmonary effort is normal. No respiratory distress.  ?   Breath sounds: Normal breath sounds. No  stridor. No wheezing, rhonchi or rales.  ?Chest:  ?   Chest wall: No tenderness.  ?Skin: ?   General: Skin is warm and dry.  ?Neurological:  ?   General: No focal deficit present.  ?   Mental Status: She is alert and oriented to person, place, and time. Mental status is at baseline.  ?Psychiatric:     ?   Mood and Affect: Mood normal.     ?   Behavior: Behavior normal.     ?   Thought Content: Thought content normal.     ?   Judgment: Judgment normal.  ? ? ? ? ? ? ?Assessment & Plan:  ? ?Problem List Items Addressed This Visit   ?None ?Visit Diagnoses   ? ? Pre-employment drug screening    -  Primary  ? ?  ? ? ?Follow-up: Return in about 6 months (around 04/21/2022) for CPE and labs.  ? ?PLAN ?Will work with her employer to  coordinate drug testing at site preferred by employer ?Once insurance kicks in, will have her return for CPE and labs. Will coordinate mammo and colon can screen at that time. ?Patient encouraged to call clinic with any questions, comments, or concerns. ? ?Janeece Agee, NP ?

## 2021-10-19 NOTE — Patient Instructions (Addendum)
Destiny Quinn -  ? ?Great to meet you! ? ?I will call you to let you know where to go for the drug testing when [name] calls me back. ? ?Once your insurance kicks in, let's have you come back for a physical exam and lab work.  ? ?Call sooner with any concerns! ? ?Thank you, ? ?Rich  ? ? ?C? Ph?m - ? ?R?t vui ???c g?p b?n! ? ?T?i s? g?i cho b?n ?? th?ng b?o cho b?n bi?t n?i ?? th? ma t?y khi [name] g?i l?i cho t?i. ? ?Khi b?o hi?m c?a b?n c? hi?u l?c, h?y ?? b?n quay l?i kh?m s?c kh?e v? l?m vi?c trong ph?ng th? nghi?m. ? ?G?i s?m h?n v?i b?t k? m?i quan t?m! ? ?C?m ?n, ? ?Rich ? ?If you have lab work done today you will be contacted with your lab results within the next 2 weeks.  If you have not heard from [place] then please contact us. The fastest way to get your results is to register for My Chart. ? ? ?IF you received an x-ray today, you will receive an invoice from [facility]. Please contact [facility] at [phone] with questions or concerns regarding your invoice.  ? ?IF you received labwork today, you will receive an invoice from [facility]. Please contact [facility] at [phone] with questions or concerns regarding your invoice.  ? ?Our billing staff will not be able to assist you with questions regarding bills from these companies. ? ?You will be contacted with the lab results as soon as they are available. The fastest way to get your results is to activate your My Chart account. Instructions are located on the last page of this paperwork. If you have not heard from [place] regarding the results in 2 weeks, please contact this office. ?  ? ? ?

## 2021-10-20 ENCOUNTER — Telehealth: Payer: Self-pay | Admitting: Registered Nurse

## 2021-10-20 NOTE — Telephone Encounter (Signed)
Pt called in stating that she has been waiting on Richard's call today ?

## 2021-10-20 NOTE — Telephone Encounter (Signed)
Have you heard anything from the patient Employer ? Or were you suppose to call this patient? ?

## 2021-11-13 NOTE — Telephone Encounter (Signed)
Have tried calling a couple of times and have not been able to get through. I am hoping they have gotten in touch with her. ? ?Thanks, ? ?Rich

## 2022-04-19 ENCOUNTER — Encounter: Payer: No Typology Code available for payment source | Admitting: Registered Nurse

## 2023-08-10 ENCOUNTER — Encounter: Payer: Self-pay | Admitting: Internal Medicine
